# Patient Record
Sex: Female | Born: 1975 | Race: White | Hispanic: No | Marital: Married | Smoking: Never smoker
Health system: Southern US, Community
[De-identification: ages and names within clinical notes are randomized; demographics above are authoritative.]

## PROBLEM LIST (undated history)

## (undated) DIAGNOSIS — F419 Anxiety disorder, unspecified: Secondary | ICD-10-CM

## (undated) HISTORY — DX: Anxiety disorder, unspecified: F41.9

## (undated) HISTORY — PX: CHOLECYSTECTOMY: SHX55

## (undated) HISTORY — PX: ABDOMINAL SURGERY: SHX537

---

## 2001-03-28 ENCOUNTER — Encounter: Payer: Self-pay | Admitting: Emergency Medicine

## 2001-03-28 ENCOUNTER — Emergency Department (HOSPITAL_COMMUNITY): Admission: EM | Admit: 2001-03-28 | Discharge: 2001-03-28 | Payer: Self-pay | Admitting: Emergency Medicine

## 2001-04-09 ENCOUNTER — Emergency Department (HOSPITAL_COMMUNITY): Admission: EM | Admit: 2001-04-09 | Discharge: 2001-04-09 | Payer: Self-pay | Admitting: Emergency Medicine

## 2001-04-11 ENCOUNTER — Ambulatory Visit (HOSPITAL_COMMUNITY): Admission: RE | Admit: 2001-04-11 | Discharge: 2001-04-11 | Payer: Self-pay | Admitting: Emergency Medicine

## 2001-04-11 ENCOUNTER — Encounter: Payer: Self-pay | Admitting: Emergency Medicine

## 2001-04-16 ENCOUNTER — Emergency Department (HOSPITAL_COMMUNITY): Admission: EM | Admit: 2001-04-16 | Discharge: 2001-04-16 | Payer: Self-pay | Admitting: *Deleted

## 2001-07-21 ENCOUNTER — Observation Stay (HOSPITAL_COMMUNITY): Admission: RE | Admit: 2001-07-21 | Discharge: 2001-07-22 | Payer: Self-pay | Admitting: General Surgery

## 2001-10-05 ENCOUNTER — Encounter: Admission: RE | Admit: 2001-10-05 | Discharge: 2001-10-05 | Payer: Self-pay | Admitting: Gastroenterology

## 2001-10-05 ENCOUNTER — Encounter: Payer: Self-pay | Admitting: Gastroenterology

## 2001-10-06 ENCOUNTER — Emergency Department (HOSPITAL_COMMUNITY): Admission: EM | Admit: 2001-10-06 | Discharge: 2001-10-06 | Payer: Self-pay | Admitting: Emergency Medicine

## 2002-03-23 HISTORY — PX: TUBAL LIGATION: SHX77

## 2004-09-27 ENCOUNTER — Emergency Department (HOSPITAL_COMMUNITY): Admission: EM | Admit: 2004-09-27 | Discharge: 2004-09-27 | Payer: Self-pay | Admitting: Emergency Medicine

## 2004-10-14 ENCOUNTER — Ambulatory Visit: Payer: Self-pay | Admitting: Family Medicine

## 2004-10-24 ENCOUNTER — Ambulatory Visit: Payer: Self-pay | Admitting: Family Medicine

## 2005-01-01 ENCOUNTER — Ambulatory Visit: Payer: Self-pay | Admitting: Family Medicine

## 2005-02-04 ENCOUNTER — Ambulatory Visit: Payer: Self-pay | Admitting: Family Medicine

## 2005-03-02 ENCOUNTER — Emergency Department (HOSPITAL_COMMUNITY): Admission: EM | Admit: 2005-03-02 | Discharge: 2005-03-02 | Payer: Self-pay | Admitting: Emergency Medicine

## 2005-03-04 ENCOUNTER — Ambulatory Visit: Payer: Self-pay | Admitting: Family Medicine

## 2005-04-01 ENCOUNTER — Ambulatory Visit: Payer: Self-pay | Admitting: Family Medicine

## 2005-06-25 ENCOUNTER — Ambulatory Visit: Payer: Self-pay | Admitting: Family Medicine

## 2005-12-16 ENCOUNTER — Ambulatory Visit: Payer: Self-pay | Admitting: Family Medicine

## 2006-01-18 ENCOUNTER — Ambulatory Visit: Payer: Self-pay | Admitting: Family Medicine

## 2006-03-24 ENCOUNTER — Ambulatory Visit: Payer: Self-pay | Admitting: Family Medicine

## 2006-05-31 ENCOUNTER — Ambulatory Visit: Payer: Self-pay | Admitting: Family Medicine

## 2011-07-15 ENCOUNTER — Ambulatory Visit (HOSPITAL_COMMUNITY)
Admission: RE | Admit: 2011-07-15 | Discharge: 2011-07-15 | Disposition: A | Discharge status: Home / Self Care | Payer: 59 | Source: Ambulatory Visit | Attending: Pediatrics | Admitting: Pediatrics

## 2011-07-15 ENCOUNTER — Encounter (HOSPITAL_COMMUNITY): Payer: Self-pay

## 2011-07-15 ENCOUNTER — Other Ambulatory Visit (HOSPITAL_COMMUNITY): Payer: Self-pay | Admitting: Pediatrics

## 2011-07-15 DIAGNOSIS — R509 Fever, unspecified: Secondary | ICD-10-CM

## 2011-07-15 DIAGNOSIS — R1012 Left upper quadrant pain: Secondary | ICD-10-CM | POA: Insufficient documentation

## 2011-07-15 DIAGNOSIS — R110 Nausea: Secondary | ICD-10-CM

## 2011-07-15 DIAGNOSIS — M549 Dorsalgia, unspecified: Secondary | ICD-10-CM | POA: Insufficient documentation

## 2011-07-15 DIAGNOSIS — R197 Diarrhea, unspecified: Secondary | ICD-10-CM

## 2011-07-15 MED ORDER — IOHEXOL 300 MG/ML  SOLN
100.0000 mL | Freq: Once | INTRAMUSCULAR | Status: AC | PRN
  Administered 2011-07-15: 100 mL via INTRAVENOUS

## 2012-01-19 ENCOUNTER — Encounter (HOSPITAL_COMMUNITY): Payer: Self-pay | Admitting: Emergency Medicine

## 2012-01-19 ENCOUNTER — Emergency Department (HOSPITAL_COMMUNITY)
Admission: EM | Admit: 2012-01-19 | Discharge: 2012-01-20 | Disposition: A | Discharge status: Home / Self Care | Payer: 59 | Attending: Emergency Medicine | Admitting: Emergency Medicine

## 2012-01-19 DIAGNOSIS — R112 Nausea with vomiting, unspecified: Secondary | ICD-10-CM | POA: Insufficient documentation

## 2012-01-19 DIAGNOSIS — Z79899 Other long term (current) drug therapy: Secondary | ICD-10-CM | POA: Insufficient documentation

## 2012-01-19 DIAGNOSIS — R197 Diarrhea, unspecified: Secondary | ICD-10-CM | POA: Insufficient documentation

## 2012-01-19 MED ORDER — ONDANSETRON HCL 4 MG/2ML IJ SOLN
4.0000 mg | Freq: Once | INTRAMUSCULAR | Status: AC
  Administered 2012-01-20: 4 mg via INTRAVENOUS
  Filled 2012-01-19: qty 2

## 2012-01-19 MED ORDER — SODIUM CHLORIDE 0.9 % IV BOLUS (SEPSIS)
1000.0000 mL | Freq: Once | INTRAVENOUS | Status: AC
  Administered 2012-01-20: 1000 mL via INTRAVENOUS

## 2012-01-19 MED ORDER — HYDROMORPHONE HCL PF 1 MG/ML IJ SOLN
1.0000 mg | Freq: Once | INTRAMUSCULAR | Status: AC
  Administered 2012-01-20: 1 mg via INTRAVENOUS
  Filled 2012-01-19: qty 1

## 2012-01-19 MED ORDER — PANTOPRAZOLE SODIUM 40 MG IV SOLR
40.0000 mg | Freq: Once | INTRAVENOUS | Status: AC
  Administered 2012-01-20: 40 mg via INTRAVENOUS
  Filled 2012-01-19: qty 40

## 2012-01-19 NOTE — ED Notes (Signed)
Pt complains of n/v/d and abdominal pain that started suddenly around 7pm tonight. Denies any injury to abdomen. States abd is cramping all over. No increased pain with palpation. States she feels like she has a "knot" in her RUQ. No other complaints. 500cc bolus and 4mg  zofran given my EMS en route.

## 2012-01-20 LAB — URINALYSIS, ROUTINE W REFLEX MICROSCOPIC
Bilirubin Urine: NEGATIVE
Glucose, UA: NEGATIVE mg/dL
Hgb urine dipstick: NEGATIVE
Ketones, ur: >80 mg/dL — AB
Leukocytes, UA: NEGATIVE
pH: 7 (ref 5.0–8.0)

## 2012-01-20 MED ORDER — ONDANSETRON HCL 4 MG/2ML IJ SOLN
4.0000 mg | Freq: Once | INTRAMUSCULAR | Status: AC
  Administered 2012-01-20: 4 mg via INTRAVENOUS
  Filled 2012-01-20: qty 2

## 2012-01-20 MED ORDER — SODIUM CHLORIDE 0.9 % IV BOLUS (SEPSIS)
1000.0000 mL | Freq: Once | INTRAVENOUS | Status: AC
  Administered 2012-01-20: 1000 mL via INTRAVENOUS

## 2012-01-20 MED ORDER — ONDANSETRON 4 MG PO TBDP: 4.0000 mg | ORAL_TABLET | Freq: Three times a day (TID) | ORAL | Status: DC | PRN

## 2012-01-20 NOTE — ED Provider Notes (Signed)
History     CSN: [id number]  Arrival date & time 01/19/12  2341   First MD Initiated Contact with Patient 01/19/12 2353      Chief Complaint  Patient presents with  . Abdominal Pain    (Consider location/radiation/quality/duration/timing/severity/associated sxs/prior treatment) HPI Danielle Pope is a 36 y.o. female brought in by ambulance, who presents to the Emergency Department complaining of nausea, vomiting and diarrhea that began at 1930. It is associated with abdominal cramping. Ate at 1800 chicken and a biscuit. Ate peanuts earlier in the day.   PCP Dr. Milford Cage   History reviewed. No pertinent past medical history.  Past Surgical History  Procedure Date  . Cholecystectomy   . Cesarean section x2    No family history on file.  History  Substance Use Topics  . Smoking status: Never Smoker   . Smokeless tobacco: Not on file  . Alcohol Use: No    OB History    Grav Para Term Preterm Abortions TAB SAB Ect Mult Living                  Review of Systems  Constitutional: Negative for fever.       10 Systems reviewed and are negative for acute change except as noted in the HPI.  HENT: Negative for congestion.   Eyes: Negative for discharge and redness.  Respiratory: Negative for cough and shortness of breath.   Cardiovascular: Negative for chest pain.  Gastrointestinal: Positive for nausea, vomiting, abdominal pain and diarrhea.  Musculoskeletal: Negative for back pain.  Skin: Negative for rash.  Neurological: Negative for syncope, numbness and headaches.  Psychiatric/Behavioral:       No behavior change.    Allergies  Review of patient's allergies indicates no known allergies.  Home Medications   Current Outpatient Rx  Name Route Sig Dispense Refill  . DULOXETINE HCL 60 MG PO CPEP Oral Take 60 mg by mouth daily.      BP 112/61  Pulse 80  Temp 98.1 F (36.7 C) (Oral)  Resp 16  Ht 5\' 5"  (1.651 m)  Wt 225 lb (102.059 kg)  BMI 37.44 kg/m2  SpO2  100%  LMP 12/26/2011  Physical Exam  Nursing note and vitals reviewed. Constitutional: She appears well-developed and well-nourished.       Awake, alert, nontoxic appearance.  HENT:  Head: Atraumatic.  Eyes: Right eye exhibits no discharge. Left eye exhibits no discharge.  Neck: Neck supple.  Cardiovascular: Normal heart sounds.   Pulmonary/Chest: Effort normal and breath sounds normal. She exhibits no tenderness.  Abdominal: Soft. There is no tenderness. There is no rebound.       Bowel sounds hyperactive. Mild tenderness across the upper abdomen  Musculoskeletal: She exhibits no tenderness.       Baseline ROM, no obvious new focal weakness.  Neurological:       Mental status and motor strength appears baseline for patient and situation.  Skin: No rash noted.  Psychiatric: She has a normal mood and affect.    ED Course  Procedures (including critical care time) Results for orders placed during the hospital encounter of 01/19/12  URINALYSIS, ROUTINE W REFLEX MICROSCOPIC      Component Value Range   Color, Urine YELLOW  YELLOW   APPearance CLEAR  CLEAR   Specific Gravity, Urine 1.020  1.005 - 1.030   pH 7.0  5.0 - 8.0   Glucose, UA NEGATIVE  NEGATIVE mg/dL   Hgb urine dipstick NEGATIVE  NEGATIVE  Bilirubin Urine NEGATIVE  NEGATIVE   Ketones, ur >80 (*) NEGATIVE mg/dL   Protein, ur NEGATIVE  NEGATIVE mg/dL   Urobilinogen, UA 0.2  0.0 - 1.0 mg/dL   Nitrite NEGATIVE  NEGATIVE   Leukocytes, UA NEGATIVE  NEGATIVE    0300 Patient has received 2.5 liters of IVF. Feels better. Able to take PO fluids. No further vomiting or diarrhea.   MDM  Patient with nausea, vomiting, diarrhea illness that began at 2000 tonight. Received fluids, antiemetics, analgesic, ppi with improvement. Pt feels improved after observation and/or treatment in ED.Pt stable in ED with no significant deterioration in condition.The patient appears reasonably screened and/or stabilized for discharge and I doubt  any other medical condition or other [facility] requiring further screening, evaluation, or treatment in the ED at this time prior to discharge.  MDM Reviewed: nursing note and vitals Interpretation: labs           Nicoletta Dress. Colon Branch, MD 01/20/12 [phone]

## 2012-05-31 ENCOUNTER — Encounter (HOSPITAL_COMMUNITY): Payer: Self-pay | Admitting: *Deleted

## 2012-05-31 ENCOUNTER — Emergency Department (HOSPITAL_COMMUNITY)
Admission: EM | Admit: 2012-05-31 | Discharge: 2012-06-01 | Disposition: A | Discharge status: Home / Self Care | Payer: Self-pay | Attending: Emergency Medicine | Admitting: Emergency Medicine

## 2012-05-31 DIAGNOSIS — R110 Nausea: Secondary | ICD-10-CM | POA: Insufficient documentation

## 2012-05-31 DIAGNOSIS — R002 Palpitations: Secondary | ICD-10-CM | POA: Insufficient documentation

## 2012-05-31 DIAGNOSIS — R42 Dizziness and giddiness: Secondary | ICD-10-CM | POA: Insufficient documentation

## 2012-05-31 NOTE — ED Provider Notes (Addendum)
History     CSN: [id number]  Arrival date & time 05/31/12  2225   First MD Initiated Contact with Patient 05/31/12 2331      Chief Complaint  Patient presents with  . Chest Pain    (Consider location/radiation/quality/duration/timing/severity/associated sxs/prior treatment) HPI Danielle Pope is a 37 y.o. female who presents to the Emergency Department complaining of palpitations. She felt palpitations last night and again today. Today they were associated with nausea and dizziness.  Currently without palpitations.   No past medical history on file.  Past Surgical History  Procedure Laterality Date  . Cholecystectomy    . Cesarean section  x2    History reviewed. No pertinent family history.  History  Substance Use Topics  . Smoking status: Never Smoker   . Smokeless tobacco: Not on file  . Alcohol Use: No    OB History   Grav Para Term Preterm Abortions TAB SAB Ect Mult Living                  Review of Systems  Constitutional: Negative for fever.       10 Systems reviewed and are negative for acute change except as noted in the HPI.  HENT: Negative for congestion.   Eyes: Negative for discharge and redness.  Respiratory: Negative for cough and shortness of breath.   Cardiovascular: Positive for palpitations. Negative for chest pain.  Gastrointestinal: Negative for vomiting and abdominal pain.  Musculoskeletal: Negative for back pain.  Skin: Negative for rash.  Neurological: Negative for syncope, numbness and headaches.  Psychiatric/Behavioral:       No behavior change.    Allergies  Review of patient's allergies indicates no known allergies.  Home Medications   Current Outpatient Rx  Name  Route  Sig  Dispense  Refill  . DULoxetine (CYMBALTA) 60 MG capsule   Oral   Take 60 mg by mouth daily.         . ondansetron (ZOFRAN ODT) 4 MG disintegrating tablet   Oral   Take 1 tablet (4 mg total) by mouth every 8 (eight) hours as needed for nausea.   20  tablet   0     BP 128/74  Pulse 67  Temp(Src) 98.4 F (36.9 C) (Oral)  Resp 16  Ht 5\' 6"  (1.676 m)  Wt 225 lb (102.059 kg)  BMI 36.33 kg/m2  SpO2 99%  LMP 05/27/2012  Physical Exam  Nursing note and vitals reviewed. Constitutional:  Awake, alert, nontoxic appearance.  HENT:  Head: Normocephalic and atraumatic.  Right Ear: External ear normal.  Left Ear: External ear normal.  Eyes: EOM are normal. Pupils are equal, round, and reactive to light. Right eye exhibits no discharge. Left eye exhibits no discharge.  Neck: Normal range of motion. Neck supple.  Cardiovascular: Normal rate, normal heart sounds and intact distal pulses.   Pulmonary/Chest: Effort normal and breath sounds normal. She exhibits no tenderness.  Abdominal: Soft. Bowel sounds are normal. There is no tenderness. There is no rebound.  Musculoskeletal: She exhibits no tenderness.  Baseline ROM, no obvious new focal weakness.  Neurological:  Mental status and motor strength appears baseline for patient and situation.  Skin: No rash noted.  Psychiatric: She has a normal mood and affect.    ED Course  Procedures (including critical care time)   Date: 05/31/2012  2349  Rate: 68  Rhythm: normal sinus rhythm and premature ventricular contractions (PVC)  QRS Axis: normal  Intervals: normal  ST/T Wave  abnormalities: normal  Conduction Disutrbances:none  Narrative Interpretation:   Old EKG Reviewed: none available    MDM  Patient with c/o palpitations. Instructed the patient on conditions that will exacerbate palpitations. Pt stable in ED with no significant deterioration in condition.The patient appears reasonably screened and/or stabilized for discharge and I doubt any other medical condition or other [facility] requiring further screening, evaluation, or treatment in the ED at this time prior to discharge.  MDM Reviewed: nursing note and vitals Interpretation: x-ray           Nicoletta Dress. Colon Branch,  MD 05/31/12 2349  Nicoletta Dress. Colon Branch, MD 05/31/12 1610

## 2012-05-31 NOTE — ED Notes (Signed)
Pt c/o palpitations. Denies nausea, dizziness, blurred vision.

## 2012-05-31 NOTE — ED Notes (Signed)
Sunday with palpitations and today 30 minutes ago, dizziness, nausea; denies hx of palpitations

## 2012-06-01 MED ORDER — ONDANSETRON 4 MG PO TBDP: 4.0000 mg | ORAL_TABLET | Freq: Three times a day (TID) | ORAL | Status: DC | PRN

## 2012-07-29 ENCOUNTER — Ambulatory Visit (INDEPENDENT_AMBULATORY_CARE_PROVIDER_SITE_OTHER): Payer: 59 | Admitting: Internal Medicine

## 2012-07-29 ENCOUNTER — Encounter: Payer: Self-pay | Admitting: Internal Medicine

## 2012-07-29 VITALS — BP 121/80 | HR 67 | Ht 66.0 in | Wt 250.0 lb

## 2012-07-29 DIAGNOSIS — E7800 Pure hypercholesterolemia, unspecified: Secondary | ICD-10-CM

## 2012-07-29 DIAGNOSIS — R002 Palpitations: Secondary | ICD-10-CM

## 2012-07-29 DIAGNOSIS — I10 Essential (primary) hypertension: Secondary | ICD-10-CM

## 2012-07-29 LAB — BASIC METABOLIC PANEL
BUN: 11 mg/dL (ref 6–23)
Creat: 0.8 mg/dL (ref 0.50–1.10)
Potassium: 3.9 mEq/L (ref 3.5–5.3)

## 2012-07-29 LAB — CBC
HCT: 37.5 % (ref 36.0–46.0)
Hemoglobin: 13 g/dL (ref 12.0–15.0)
MCHC: 34.7 g/dL (ref 30.0–36.0)

## 2012-07-29 LAB — LIPID PANEL
Cholesterol: 190 mg/dL (ref 0–200)
Total CHOL/HDL Ratio: 4.3 Ratio
VLDL: 40 mg/dL (ref 0–40)

## 2012-07-29 NOTE — Progress Notes (Signed)
HPI Patient is referred for evluation of palpitations.  They began around march 9th  She was in yard working .  When they did not go away she went to [facility] ER>   She says the palpitations occur most every day  Usually notices them when sitting or in bed.  Bad at times  Has to sit up.  No dizziness.  No CP  Most of time breathing is OK WPatient has acid reflux Was drinking diet Dr Reino Kent before  None since ER visit.  Patient's MGM died of CAD at 68.  Great uncle with CAD No Known Allergies  Current Outpatient Prescriptions  Medication Sig Dispense Refill  . DULoxetine (CYMBALTA) 60 MG capsule Take 60 mg by mouth daily.      Marland Kitchen LORazepam (ATIVAN) 0.5 MG tablet Take 0.5 mg by mouth as needed for anxiety.      . ondansetron (ZOFRAN ODT) 4 MG disintegrating tablet Take 1 tablet (4 mg total) by mouth every 8 (eight) hours as needed for nausea.  20 tablet  0   No current facility-administered medications for this visit.    No past medical history on file.  Past Surgical History  Procedure Laterality Date  . Cholecystectomy    . Cesarean section  x2    No family history on file.  History   Social History  . Marital Status: Married    Spouse Name: N/A    Number of Children: N/A  . Years of Education: N/A   Occupational History  . Not on file.   Social History Main Topics  . Smoking status: Never Smoker   . Smokeless tobacco: Not on file  . Alcohol Use: No  . Drug Use: No  . Sexually Active:    Other Topics Concern  . Not on file   Social History Narrative  . No narrative on file    Review of Systems:  All systems reviewed.  They are negative to the above problem except as previously stated.  Vital Signs: BP 121/80  Pulse 67  Ht 5\' 6"  (1.676 m)  Wt 250 lb (113.399 kg)  BMI 40.37 kg/m2  Physical Exam Patient is in NAD HEENT:  Normocephalic, atraumatic. EOMI, PERRLA.  Neck: JVP is normal.  No bruits.  Lungs: clear to auscultation. No rales no wheezes.  Heart:  Regular rate and rhythm. Normal S1, S2. No S3.   No significant murmurs. PMI not displaced.  Abdomen:  Supple, nontender. Normal bowel sounds. No masses. No hepatomegaly.  Extremities:   Good distal pulses throughout. No lower extremity edema.  Musculoskeletal :moving all extremities.  Neuro:   alert and oriented x3.  CN II-XII grossly intact.  EKG in ER 3/11  SR 64  Isolated PVC  QT interval normal Assessment and Plan:  1.  PVCs.  Patient has been symptomatic with skips since March  Do not sound hemodynamically destabilizing but botherome   Would recomm echo, 48 hour holter, CBC, BMET, TSH, Lipid panel  2.  HCM  Check lipids  Will need to discuss weight  Defer until w/u back.

## 2012-07-29 NOTE — Patient Instructions (Addendum)
Your physician recommends that you schedule a follow-up appointment in: FOLLOW UP TO BE DETERMINED  Your physician has requested that you have an echocardiogram. Echocardiography is a painless test that uses sound waves to create images of your heart. It provides your doctor with information about the size and shape of your heart and how well your heart's chambers and valves are working. This procedure takes approximately one hour. There are no restrictions for this procedure.  Your physician has recommended that you wear a holter monitor. Holter monitors are medical devices that record the heart's electrical activity. Doctors most often use these monitors to diagnose arrhythmias. Arrhythmias are problems with the speed or rhythm of the heartbeat. The monitor is a small, portable device. You can wear one while you do your normal daily activities. This is usually used to diagnose what is causing palpitations/syncope (passing out).48 HOURS  Your physician recommends that you return for lab work in: TODAY (CBC,BMET,TSH,LIPID) SLIPS GIVEN

## 2012-08-01 ENCOUNTER — Encounter: Payer: Self-pay | Admitting: *Deleted

## 2012-08-03 ENCOUNTER — Ambulatory Visit (HOSPITAL_COMMUNITY)
Admission: RE | Admit: 2012-08-03 | Discharge: 2012-08-03 | Disposition: A | Discharge status: Home / Self Care | Payer: 59 | Source: Ambulatory Visit | Attending: Internal Medicine | Admitting: Internal Medicine

## 2012-08-03 DIAGNOSIS — Z8249 Family history of ischemic heart disease and other diseases of the circulatory system: Secondary | ICD-10-CM | POA: Insufficient documentation

## 2012-08-03 DIAGNOSIS — R002 Palpitations: Secondary | ICD-10-CM | POA: Insufficient documentation

## 2012-08-03 DIAGNOSIS — R079 Chest pain, unspecified: Secondary | ICD-10-CM

## 2012-08-03 DIAGNOSIS — I4949 Other premature depolarization: Secondary | ICD-10-CM | POA: Insufficient documentation

## 2012-08-03 NOTE — Progress Notes (Signed)
*  PRELIMINARY RESULTS* Echocardiogram 2D Echocardiogram has been performed.  Danielle Pope 08/03/2012, 1:47 PM

## 2012-08-03 NOTE — Progress Notes (Signed)
*  PRELIMINARY RESULTS* Echocardiogram 48H Holter monitor has been performed.  Conrad Whalan 08/03/2012, 1:48 PM

## 2012-08-10 ENCOUNTER — Telehealth: Payer: Self-pay | Admitting: *Deleted

## 2012-08-10 NOTE — Telephone Encounter (Signed)
PT CALLING FOR TEST RESULTS DONE LAST WEEK.TMJ

## 2012-08-10 NOTE — Telephone Encounter (Signed)
Results are still pending for holter and echo, noted pt home number states "All circuits are busy now, please try call again later" times two, will call again

## 2012-08-11 NOTE — Procedures (Signed)
Danielle Pope, PRUNTY NO.:  [id number]  MEDICAL RECORD NO.:  [id number]  LOCATION:  CARDIOPU                      FACILITY:  [facility]  PHYSICIAN:  Gerrit Friends. Dietrich Pates, MD, FACCDATE OF BIRTH:  04-29-1975  DATE OF PROCEDURE:  08/03/2012 DATE OF DISCHARGE:  08/03/2012                               HOLTER MONITOR   REFERRING DOCTOR:  Delaney Meigs, M.D.  CLINICAL DATA:  37 year old woman with PVCs and chest discomfort.  1. Continuous electrocardiographic recording was maintained for 47     hours and 53 minutes during which the predominant rhythm was normal     sinus with sinus tachycardia to rates as high as 125 BPM and modest     sinus bradycardia with a minimal recorded rate of 52 BPM. 2. Moderately frequent PVCs occurred at an average rate of 28 per     hour.  Frequency increased at times with a few short runs of     bigeminy and trigeminy. 3. Infrequent premature supraventricular depolarizations were recorded;      average rate was 3 per hour. 4. No significant ST-segment elevation or depression was identified. 5. Multiple episodes of palpitations were reported, a few with     accompanying arm pain and one with associated dizziness.  For some     of these, the patient reported "multiple PVCs" and for some "light     PVCs."  For the first 5 episodes during which no comment concerning PVCs      was provided by the patient, none occurred.  Subsequently, in     approximately 2/3 of the symptomatic episodes, at least 1 PVC was     present, sometimes frequent PVCs.  IMPRESSION:  Abnormal continuous electrocardiographic recording revealing no significant arrhythmias and a correlation between the patient's reports of PVCs and documented PVCs.  Palpitations not infrequently occurred without any associated arrhythmia.  There is no clear relationship to arm discomfort or dizziness.  Gerrit Friends. Dietrich Pates, MD, [facility]     RMR/MEDQ  D:  08/10/2012  T:  08/11/2012  Job:   [id number]

## 2012-08-17 NOTE — Telephone Encounter (Signed)
Called pt mobile and noted no vm has been set up, called home number and noted all circuits are busy now, will call again

## 2012-08-17 NOTE — Telephone Encounter (Signed)
Pt requesting results from Echo, noted ordered by Joette Catching, however pt advised by you at last OV to have echo, please advise

## 2012-08-17 NOTE — Telephone Encounter (Signed)
Echo did not come back to me for review so I missed it LV function is normal.  No significant valvular abnormalities.

## 2012-08-18 NOTE — Telephone Encounter (Signed)
Tried to call pt again and noted the same issue as prior:  Called pt mobile and noted no vm has been set up, called home number and noted all circuits are busy now, will call again

## 2012-08-19 NOTE — Telephone Encounter (Signed)
Spoke to pt to advise results/instructions. Pt understood.  

## 2015-10-08 ENCOUNTER — Ambulatory Visit (HOSPITAL_COMMUNITY)
Admission: RE | Admit: 2015-10-08 | Discharge: 2015-10-08 | Disposition: A | Discharge status: Home / Self Care | Payer: Self-pay | Source: Ambulatory Visit | Attending: Physician Assistant | Admitting: Physician Assistant

## 2015-10-08 ENCOUNTER — Other Ambulatory Visit (HOSPITAL_COMMUNITY): Payer: Self-pay | Admitting: Physician Assistant

## 2015-10-08 DIAGNOSIS — M5441 Lumbago with sciatica, right side: Secondary | ICD-10-CM | POA: Insufficient documentation

## 2015-10-08 DIAGNOSIS — M5146 Schmorl's nodes, lumbar region: Secondary | ICD-10-CM | POA: Insufficient documentation

## 2015-10-08 DIAGNOSIS — M4807 Spinal stenosis, lumbosacral region: Secondary | ICD-10-CM | POA: Insufficient documentation

## 2015-10-08 DIAGNOSIS — M4806 Spinal stenosis, lumbar region: Secondary | ICD-10-CM | POA: Insufficient documentation

## 2015-10-08 DIAGNOSIS — M542 Cervicalgia: Secondary | ICD-10-CM | POA: Insufficient documentation

## 2015-10-08 DIAGNOSIS — M5440 Lumbago with sciatica, unspecified side: Secondary | ICD-10-CM

## 2016-07-21 HISTORY — PX: GASTRIC BYPASS: SHX52

## 2020-01-27 ENCOUNTER — Emergency Department (HOSPITAL_COMMUNITY): Payer: Self-pay

## 2020-01-27 ENCOUNTER — Encounter (HOSPITAL_COMMUNITY): Payer: Self-pay | Admitting: Emergency Medicine

## 2020-01-27 ENCOUNTER — Other Ambulatory Visit: Payer: Self-pay

## 2020-01-27 ENCOUNTER — Emergency Department (HOSPITAL_COMMUNITY)
Admission: EM | Admit: 2020-01-27 | Discharge: 2020-01-28 | Disposition: A | Discharge status: Home / Self Care | Payer: Self-pay | Attending: Emergency Medicine | Admitting: Emergency Medicine

## 2020-01-27 DIAGNOSIS — R350 Frequency of micturition: Secondary | ICD-10-CM | POA: Insufficient documentation

## 2020-01-27 DIAGNOSIS — R109 Unspecified abdominal pain: Secondary | ICD-10-CM

## 2020-01-27 DIAGNOSIS — R1011 Right upper quadrant pain: Secondary | ICD-10-CM | POA: Insufficient documentation

## 2020-01-27 DIAGNOSIS — Z9884 Bariatric surgery status: Secondary | ICD-10-CM | POA: Insufficient documentation

## 2020-01-27 LAB — URINALYSIS, ROUTINE W REFLEX MICROSCOPIC
Bilirubin Urine: NEGATIVE
Glucose, UA: NEGATIVE mg/dL
Hgb urine dipstick: NEGATIVE
Ketones, ur: NEGATIVE mg/dL
Leukocytes,Ua: NEGATIVE
Nitrite: NEGATIVE
Protein, ur: NEGATIVE mg/dL
Specific Gravity, Urine: 1.02 (ref 1.005–1.030)
pH: 5 (ref 5.0–8.0)

## 2020-01-27 LAB — LIPASE, BLOOD: Lipase: 27 U/L (ref 11–51)

## 2020-01-27 LAB — CBC
HCT: 33.6 % — ABNORMAL LOW (ref 36.0–46.0)
Hemoglobin: 10.4 g/dL — ABNORMAL LOW (ref 12.0–15.0)
MCH: 26.5 pg (ref 26.0–34.0)
MCHC: 31 g/dL (ref 30.0–36.0)
MCV: 85.5 fL (ref 80.0–100.0)
Platelets: 258 10*3/uL (ref 150–400)
RBC: 3.93 MIL/uL (ref 3.87–5.11)
RDW: 14.4 % (ref 11.5–15.5)
WBC: 8.3 10*3/uL (ref 4.0–10.5)
nRBC: 0 % (ref 0.0–0.2)

## 2020-01-27 LAB — COMPREHENSIVE METABOLIC PANEL
ALT: 21 U/L (ref 0–44)
AST: 17 U/L (ref 15–41)
Albumin: 4.2 g/dL (ref 3.5–5.0)
Alkaline Phosphatase: 101 U/L (ref 38–126)
Anion gap: 8 (ref 5–15)
BUN: 11 mg/dL (ref 6–20)
CO2: 25 mmol/L (ref 22–32)
Calcium: 8.7 mg/dL — ABNORMAL LOW (ref 8.9–10.3)
Chloride: 105 mmol/L (ref 98–111)
Creatinine, Ser: 0.64 mg/dL (ref 0.44–1.00)
GFR, Estimated: >60 mL/min (ref >60)
Glucose, Bld: 104 mg/dL — ABNORMAL HIGH (ref 70–99)
Potassium: 3.2 mmol/L — ABNORMAL LOW (ref 3.5–5.1)
Sodium: 138 mmol/L (ref 135–145)
Total Bilirubin: 0.3 mg/dL (ref 0.3–1.2)
Total Protein: 7.1 g/dL (ref 6.5–8.1)

## 2020-01-27 LAB — POC URINE PREG, ED: Preg Test, Ur: NEGATIVE

## 2020-01-27 MED ORDER — IOHEXOL 300 MG/ML  SOLN
100.0000 mL | Freq: Once | INTRAMUSCULAR | Status: AC | PRN
  Administered 2020-01-27: 100 mL via INTRAVENOUS

## 2020-01-27 MED ORDER — FENTANYL CITRATE (PF) 100 MCG/2ML IJ SOLN
50.0000 ug | Freq: Once | INTRAMUSCULAR | Status: AC
  Administered 2020-01-27: 50 ug via INTRAVENOUS
  Filled 2020-01-27: qty 2

## 2020-01-27 MED ORDER — ONDANSETRON HCL 4 MG/2ML IJ SOLN
4.0000 mg | Freq: Once | INTRAMUSCULAR | Status: AC
  Administered 2020-01-27: 4 mg via INTRAVENOUS
  Filled 2020-01-27: qty 2

## 2020-01-27 NOTE — ED Triage Notes (Signed)
Pt c/o right sided flank pain that started at 0400 today.

## 2020-01-27 NOTE — ED Provider Notes (Signed)
[facility] EMERGENCY DEPARTMENT Provider Note   CSN: [id number] Arrival date & time: 01/27/20  2117     History Chief Complaint  Patient presents with  . Flank Pain    Danielle Pope is a 44 y.o. female who presents emergency department chief complaint of right upper quadrant abdominal pain.  She is status post gastric bypass and cholecystectomy.  Patient states that she had onset of f right upper quadrant abdominal pain radiating to her back starting 2 days ago however it was intermittent and seem to go away.  This morning at 4 AM she was awoken from sleep and has had persistent pain radiating to her left upper back/flank region she denies fevers, chills, nausea, vomiting, diarrhea or constipation.  She has noticed some increased urinary frequency but denies dysuria, hematuria.  She denies chest pain, shortness of breath.  HPI     History reviewed. No pertinent past medical history.  There are no problems to display for this patient.   Past Surgical History:  Procedure Laterality Date  . CESAREAN SECTION  x2  . CHOLECYSTECTOMY    . GASTRIC BYPASS  07/2016     OB History   No obstetric history on file.     Family History  Problem Relation Age of Onset  . Heart attack Maternal Grandmother     Social History   Tobacco Use  . Smoking status: Never Smoker  Substance Use Topics  . Alcohol use: No  . Drug use: No    Home Medications Prior to Admission medications   Medication Sig Start Date End Date Taking? Authorizing Provider  DULoxetine (CYMBALTA) 60 MG capsule Take 60 mg by mouth daily.   Yes [provider]  LORazepam (ATIVAN) 0.5 MG tablet Take 0.5 mg by mouth as needed for anxiety.   Yes [provider]  ondansetron (ZOFRAN ODT) 4 MG disintegrating tablet Take 1 tablet (4 mg total) by mouth every 8 (eight) hours as needed for nausea. 01/20/12  Yes Annamarie Dawley, MD  Probiotic Product (PROBIOTIC ADVANCED PO) Take 1 capsule by mouth daily.    Yes [provider]    Allergies    Nsaids  Review of Systems   Review of Systems Ten systems reviewed and are negative for acute change, except as noted in the HPI.   Physical Exam Updated Vital Signs BP 113/76   Pulse 69   Temp 98.2 F (36.8 C)   Resp 18   Ht 5\' 4"  (1.626 m)   Wt 89.8 kg   SpO2 100%   BMI 33.99 kg/m   Physical Exam Vitals and nursing note reviewed.  Constitutional:      General: She is not in acute distress.    Appearance: She is well-developed. She is not diaphoretic.  HENT:     Head: Normocephalic and atraumatic.  Eyes:     General: No scleral icterus.    Conjunctiva/sclera: Conjunctivae normal.  Cardiovascular:     Rate and Rhythm: Normal rate and regular rhythm.     Heart sounds: Normal heart sounds. No murmur heard.  No friction rub. No gallop.   Pulmonary:     Effort: Pulmonary effort is normal. No respiratory distress.     Breath sounds: Normal breath sounds.  Abdominal:     General: Bowel sounds are normal. There is no distension.     Palpations: Abdomen is soft. There is no mass.     Tenderness: There is abdominal tenderness in the right upper quadrant.  There is no right CVA tenderness, left CVA tenderness or guarding.  Musculoskeletal:     Cervical back: Normal range of motion.  Skin:    General: Skin is warm and dry.  Neurological:     Mental Status: She is alert and oriented to person, place, and time.  Psychiatric:        Behavior: Behavior normal.     ED Results / Procedures / Treatments   Labs (all labs ordered are listed, but only abnormal results are displayed) Labs Reviewed  CBC - Abnormal; Notable for the following components:      Result Value   Hemoglobin 10.4 (*)    HCT 33.6 (*)    All other components within normal limits  URINALYSIS, ROUTINE W REFLEX MICROSCOPIC  COMPREHENSIVE METABOLIC PANEL  LIPASE, BLOOD  POC URINE PREG, ED    EKG None  Radiology No results  found.  Procedures Procedures (including critical care time)  Medications Ordered in ED Medications  fentaNYL (SUBLIMAZE) injection 50 mcg (has no administration in time range)  ondansetron (ZOFRAN) injection 4 mg (has no administration in time range)    ED Course  I have reviewed the triage vital signs and the nursing notes.  Pertinent labs & imaging results that were available during my care of the patient were reviewed by me and considered in my medical decision making (see chart for details).    MDM Rules/Calculators/A&P                           Patient here with RUQ abd pain and flank pain. S/p lap chole and gastric bypass. Labs ordered including  CMP. Patient work up pending. Sign out given to Dr. Wilkie Aye. Final Clinical Impression(s) / ED Diagnoses Final diagnoses:  None    Rx / DC Orders ED Discharge Orders    None       Arthor Captain, PA-C 01/27/20 2309    Shon Baton, MD 01/28/20 0140

## 2020-01-28 MED ORDER — ONDANSETRON 4 MG PO TBDP: 4.0000 mg | ORAL_TABLET | Freq: Three times a day (TID) | ORAL | 0 refills | Status: DC | PRN

## 2020-01-28 NOTE — ED Notes (Signed)
Pt tolerated PO Fluid and snack without complication. MD Notified.

## 2020-01-28 NOTE — Discharge Instructions (Signed)
You were seen today for abdominal and flank pain.  Your work-up is reassuring including CT scan.  Your CT scan does have a small nodule in the lower lung.  If you are a non-smoker, this does not require follow-up and is low risk.  If you are smoker, you need a CT scan in 12 months to make sure this is stable.  Make sure that you are staying hydrated.

## 2020-02-02 ENCOUNTER — Emergency Department (HOSPITAL_COMMUNITY)
Admission: EM | Admit: 2020-02-02 | Discharge: 2020-02-02 | Disposition: A | Discharge status: Home / Self Care | Payer: Self-pay | Attending: Emergency Medicine | Admitting: Emergency Medicine

## 2020-02-02 ENCOUNTER — Encounter (HOSPITAL_COMMUNITY): Payer: Self-pay | Admitting: Emergency Medicine

## 2020-02-02 ENCOUNTER — Other Ambulatory Visit: Payer: Self-pay

## 2020-02-02 DIAGNOSIS — R109 Unspecified abdominal pain: Secondary | ICD-10-CM | POA: Insufficient documentation

## 2020-02-02 DIAGNOSIS — Z5321 Procedure and treatment not carried out due to patient leaving prior to being seen by health care provider: Secondary | ICD-10-CM | POA: Insufficient documentation

## 2020-02-02 LAB — URINALYSIS, ROUTINE W REFLEX MICROSCOPIC
Bilirubin Urine: NEGATIVE
Glucose, UA: NEGATIVE mg/dL
Hgb urine dipstick: NEGATIVE
Ketones, ur: 5 mg/dL — AB
Leukocytes,Ua: NEGATIVE
Nitrite: NEGATIVE
Protein, ur: NEGATIVE mg/dL
Specific Gravity, Urine: 1.021 (ref 1.005–1.030)
pH: 5 (ref 5.0–8.0)

## 2020-02-02 LAB — POC URINE PREG, ED: Preg Test, Ur: NEGATIVE

## 2020-02-02 NOTE — ED Provider Notes (Signed)
I went to see the patient and she was not in the room   Mancel Bale, MD 02/02/20 [phone]

## 2020-02-02 NOTE — ED Triage Notes (Signed)
Pt c/o abdominal pain that started tonight. Pt recently seen here for flank pain

## 2020-02-02 NOTE — ED Notes (Signed)
Pt assisted to bathroom and back to bed 

## 2020-05-16 ENCOUNTER — Encounter (HOSPITAL_COMMUNITY): Payer: Self-pay | Admitting: Emergency Medicine

## 2020-10-25 ENCOUNTER — Emergency Department (HOSPITAL_COMMUNITY): Payer: No Typology Code available for payment source

## 2020-10-25 ENCOUNTER — Emergency Department (HOSPITAL_COMMUNITY)
Admission: EM | Admit: 2020-10-25 | Discharge: 2020-10-25 | Disposition: A | Discharge status: Other Acute Inpatient Hospital | Payer: No Typology Code available for payment source | Attending: Emergency Medicine | Admitting: Emergency Medicine

## 2020-10-25 ENCOUNTER — Encounter (HOSPITAL_COMMUNITY): Payer: Self-pay

## 2020-10-25 ENCOUNTER — Other Ambulatory Visit: Payer: Self-pay

## 2020-10-25 DIAGNOSIS — Z20822 Contact with and (suspected) exposure to covid-19: Secondary | ICD-10-CM | POA: Diagnosis not present

## 2020-10-25 DIAGNOSIS — R1012 Left upper quadrant pain: Secondary | ICD-10-CM | POA: Diagnosis present

## 2020-10-25 DIAGNOSIS — Z9884 Bariatric surgery status: Secondary | ICD-10-CM | POA: Diagnosis not present

## 2020-10-25 DIAGNOSIS — K631 Perforation of intestine (nontraumatic): Secondary | ICD-10-CM | POA: Insufficient documentation

## 2020-10-25 LAB — COMPREHENSIVE METABOLIC PANEL
ALT: 16 U/L (ref 0–44)
AST: 16 U/L (ref 15–41)
Albumin: 3.8 g/dL (ref 3.5–5.0)
Alkaline Phosphatase: 95 U/L (ref 38–126)
Anion gap: 6 (ref 5–15)
BUN: 11 mg/dL (ref 6–20)
CO2: 24 mmol/L (ref 22–32)
Calcium: 8.2 mg/dL — ABNORMAL LOW (ref 8.9–10.3)
Chloride: 107 mmol/L (ref 98–111)
Creatinine, Ser: 0.59 mg/dL (ref 0.44–1.00)
GFR, Estimated: >60 mL/min (ref >60)
Glucose, Bld: 109 mg/dL — ABNORMAL HIGH (ref 70–99)
Potassium: 3.6 mmol/L (ref 3.5–5.1)
Sodium: 137 mmol/L (ref 135–145)
Total Bilirubin: 0.4 mg/dL (ref 0.3–1.2)
Total Protein: 6.9 g/dL (ref 6.5–8.1)

## 2020-10-25 LAB — CBC
HCT: 28.9 % — ABNORMAL LOW (ref 36.0–46.0)
Hemoglobin: 8.8 g/dL — ABNORMAL LOW (ref 12.0–15.0)
MCH: 23.5 pg — ABNORMAL LOW (ref 26.0–34.0)
MCHC: 30.4 g/dL (ref 30.0–36.0)
MCV: 77.3 fL — ABNORMAL LOW (ref 80.0–100.0)
Platelets: 339 10*3/uL (ref 150–400)
RBC: 3.74 MIL/uL — ABNORMAL LOW (ref 3.87–5.11)
RDW: 15.8 % — ABNORMAL HIGH (ref 11.5–15.5)
WBC: 8.3 10*3/uL (ref 4.0–10.5)
nRBC: 0 % (ref 0.0–0.2)

## 2020-10-25 LAB — RESP PANEL BY RT-PCR (FLU A&B, COVID) ARPGX2
Influenza A by PCR: NEGATIVE
Influenza B by PCR: NEGATIVE
SARS Coronavirus 2 by RT PCR: NEGATIVE

## 2020-10-25 LAB — POC URINE PREG, ED: Preg Test, Ur: NEGATIVE

## 2020-10-25 LAB — LIPASE, BLOOD: Lipase: 28 U/L (ref 11–51)

## 2020-10-25 MED ORDER — PIPERACILLIN-TAZOBACTAM 3.375 G IVPB 30 MIN
3.3750 g | Freq: Once | INTRAVENOUS | Status: AC
  Administered 2020-10-25: 3.375 g via INTRAVENOUS
  Filled 2020-10-25: qty 50

## 2020-10-25 MED ORDER — SODIUM CHLORIDE 0.9 % IV SOLN: INTRAVENOUS | Status: DC

## 2020-10-25 MED ORDER — IOHEXOL 350 MG/ML SOLN
85.0000 mL | Freq: Once | INTRAVENOUS | Status: AC | PRN
  Administered 2020-10-25: 85 mL via INTRAVENOUS

## 2020-10-25 MED ORDER — SODIUM CHLORIDE 0.9 % IV BOLUS
1000.0000 mL | Freq: Once | INTRAVENOUS | Status: AC
  Administered 2020-10-25: 1000 mL via INTRAVENOUS

## 2020-10-25 MED ORDER — ONDANSETRON 4 MG PO TBDP
4.0000 mg | ORAL_TABLET | Freq: Once | ORAL | Status: AC
  Administered 2020-10-25: 4 mg via ORAL
  Filled 2020-10-25: qty 1

## 2020-10-25 MED ORDER — ALUM & MAG HYDROXIDE-SIMETH 200-200-20 MG/5ML PO SUSP
30.0000 mL | Freq: Once | ORAL | Status: AC
  Administered 2020-10-25: 30 mL via ORAL
  Filled 2020-10-25: qty 30

## 2020-10-25 MED ORDER — HYDROMORPHONE HCL 1 MG/ML IJ SOLN
0.5000 mg | Freq: Once | INTRAMUSCULAR | Status: AC
  Administered 2020-10-25: 0.5 mg via INTRAVENOUS
  Filled 2020-10-25: qty 1

## 2020-10-25 MED ORDER — HYDROMORPHONE HCL 1 MG/ML IJ SOLN
1.0000 mg | Freq: Once | INTRAMUSCULAR | Status: AC
  Administered 2020-10-25: 1 mg via INTRAVENOUS
  Filled 2020-10-25: qty 1

## 2020-10-25 MED ORDER — SIMETHICONE 80 MG PO CHEW
80.0000 mg | CHEWABLE_TABLET | Freq: Once | ORAL | Status: AC
  Administered 2020-10-25: 80 mg via ORAL
  Filled 2020-10-25: qty 1

## 2020-10-25 NOTE — ED Triage Notes (Signed)
Rcems from home with cc of abdominal pain- possibly gas pain. Started last night.

## 2020-10-25 NOTE — ED Provider Notes (Signed)
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  She has intraperitoneal air.  I discussed the case with her general surgeon Dr. Lovell Sheehan who has also discussed the case with the bariatric surgeon Dr. Adolphus Birchwood, they have agreed that this patient would likely best be served at the facility where the original surgery took place and by the physicians and surgeons that have cared for her in the past.  I have spoken with Dr. Townsend Roger he, surgeon on-call for general surgery who has accepted the patient and request that the patient go emergency department to emergency department.  She has received Zosyn, her vital signs have remained stable, she is not tachycardic or hypotensive or febrile.   Eber Hong, MD 10/25/20 1022

## 2020-10-25 NOTE — ED Notes (Addendum)
Occupational psychologist paged

## 2020-10-25 NOTE — Consult Note (Signed)
Reason for Consult: Gastrojejunostomy anastomotic perforation, status post Roux-en-Y gastric bypass surgery Referring Physician: Dr. Kizzie Furnish is an 45 y.o. female.  HPI: Patient is a 45 year old female status post gastric bypass surgery at [facility] in [place] in 2019 who presents with a 1 month history of worsening epigastric pain.  She was seen by her gastroenterologist recently for diarrhea.  She presented to the emergency room today due to worsening epigastric pain.  A CT scan of the abdomen reveals evidence of a gastrojejunostomy anastomotic perforation with some free air.  No abscess cavity is present.  The free air seems to be localized to the area of the anastomosis.  Patient denies any fever or chills.  History reviewed. No pertinent past medical history.  Past Surgical History:  Procedure Laterality Date   CESAREAN SECTION  x2   CHOLECYSTECTOMY     GASTRIC BYPASS  07/2016    Family History  Problem Relation Age of Onset   Heart attack Maternal Grandmother     Social History:  reports that she has never smoked. She has never used smokeless tobacco. She reports that she does not drink alcohol and does not use drugs.  Allergies:  Allergies  Allergen Reactions   Nsaids Anaphylaxis and Other (See Comments)   Aspirin Other (See Comments)    Medications: Prior to Admission: (Not in a hospital admission)   Results for orders placed or performed during the hospital encounter of 10/25/20 (from the past 48 hour(s))  POC Urine Pregnancy, ED (not at [facility])     Status: None   Collection Time: 10/25/20  4:29 AM  Result Value Ref Range   Preg Test, Ur NEGATIVE NEGATIVE    Comment:        THE SENSITIVITY OF THIS METHODOLOGY IS >24 mIU/mL   CBC     Status: Abnormal   Collection Time: 10/25/20  4:40 AM  Result Value Ref Range   WBC 8.3 4.0 - 10.5 K/uL   RBC 3.74 (L) 3.87 - 5.11 MIL/uL   Hemoglobin 8.8 (L) 12.0 - 15.0 g/dL    Comment: Reticulocyte  Hemoglobin testing may be clinically indicated, consider ordering this additional test [id number]    HCT 28.9 (L) 36.0 - 46.0 %   MCV 77.3 (L) 80.0 - 100.0 fL   MCH 23.5 (L) 26.0 - 34.0 pg   MCHC 30.4 30.0 - 36.0 g/dL   RDW 62.2 (H) 29.7 - 98.9 %   Platelets 339 150 - 400 K/uL   nRBC 0.0 0.0 - 0.2 %    Comment: Performed at [facility], [address] [place], [place] [address]  Comprehensive metabolic panel     Status: Abnormal   Collection Time: 10/25/20  4:40 AM  Result Value Ref Range   Sodium 137 135 - 145 mmol/L   Potassium 3.6 3.5 - 5.1 mmol/L   Chloride 107 98 - 111 mmol/L   CO2 24 22 - 32 mmol/L   Glucose, Bld 109 (H) 70 - 99 mg/dL    Comment: Glucose reference range applies only to samples taken after fasting for at least 8 hours.   BUN 11 6 - 20 mg/dL   Creatinine, Ser 1.74 0.44 - 1.00 mg/dL   Calcium 8.2 (L) 8.9 - 10.3 mg/dL   Total Protein 6.9 6.5 - 8.1 g/dL   Albumin 3.8 3.5 - 5.0 g/dL   AST 16 15 - 41 U/L   ALT 16 0 - 44 U/L   Alkaline Phosphatase  95 38 - 126 U/L   Total Bilirubin 0.4 0.3 - 1.2 mg/dL   GFR, Estimated >83 >66 mL/min    Comment: (NOTE) Calculated using the CKD-EPI Creatinine Equation (2021)    Anion gap 6 5 - 15    Comment: Performed at [facility], [address] [place], [place] [address]  Lipase, blood     Status: None   Collection Time: 10/25/20  4:40 AM  Result Value Ref Range   Lipase 28 11 - 51 U/L    Comment: Performed at [facility], [address], [place], [place] [address]  Resp Panel by RT-PCR (Flu A&B, Covid) Nasopharyngeal Swab     Status: None   Collection Time: 10/25/20  7:05 AM   Specimen: Nasopharyngeal Swab; Nasopharyngeal(NP) swabs in vial transport medium  Result Value Ref Range   SARS Coronavirus 2 by RT PCR NEGATIVE NEGATIVE    Comment: (NOTE) SARS-CoV-2 target nucleic acids are NOT DETECTED.  The SARS-CoV-2 RNA is generally detectable in upper respiratory specimens during the acute phase of infection. The  lowest concentration of SARS-CoV-2 viral copies this assay can detect is 138 copies/mL. A negative result does not preclude SARS-Cov-2 infection and should not be used as the sole basis for treatment or other patient management decisions. A negative result may occur with  improper specimen collection/handling, submission of specimen other than nasopharyngeal swab, presence of viral mutation(s) within the areas targeted by this assay, and inadequate number of viral copies(<138 copies/mL). A negative result must be combined with clinical observations, patient history, and epidemiological information. The expected result is Negative.  Fact Sheet for Patients:  BloggerCourse.com  Fact Sheet for Healthcare Providers:  SeriousBroker.it  This test is no t yet approved or cleared by the [place] FDA and  has been authorized for detection and/or diagnosis of SARS-CoV-2 by FDA under an Emergency Use Authorization (EUA). This EUA will remain  in effect (meaning this test can be used) for the duration of the COVID-19 declaration under Section 564(b)(1) of the Act, 21 U.S.C.section 360bbb-3(b)(1), unless the authorization is terminated  or revoked sooner.       Influenza A by PCR NEGATIVE NEGATIVE   Influenza B by PCR NEGATIVE NEGATIVE    Comment: (NOTE) The Xpert Xpress SARS-CoV-2/FLU/RSV plus assay is intended as an aid in the diagnosis of influenza from Nasopharyngeal swab specimens and should not be used as a sole basis for treatment. Nasal washings and aspirates are unacceptable for Xpert Xpress SARS-CoV-2/FLU/RSV testing.  Fact Sheet for Patients: BloggerCourse.com  Fact Sheet for Healthcare Providers: SeriousBroker.it  This test is not yet approved or cleared by the [place] FDA and has been authorized for detection and/or diagnosis of SARS-CoV-2 by FDA under an Emergency  Use Authorization (EUA). This EUA will remain in effect (meaning this test can be used) for the duration of the COVID-19 declaration under Section 564(b)(1) of the Act, 21 U.S.C. section 360bbb-3(b)(1), unless the authorization is terminated or revoked.  Performed at [facility], [address], [place], [place] [address]     CT ABDOMEN PELVIS W CONTRAST  Result Date: 10/25/2020 CLINICAL DATA:  Bowel obstruction suspected. Abdominal pain since 10/24/2020 with blood in stool EXAM: CT ABDOMEN AND PELVIS WITH CONTRAST TECHNIQUE: Multidetector CT imaging of the abdomen and pelvis was performed using the standard protocol following bolus administration of intravenous contrast. CONTRAST:  84mL OMNIPAQUE IOHEXOL 350 MG/ML SOLN COMPARISON:  01/27/2020 FINDINGS: Lower chest:  No contributory findings. Hepatobiliary: No focal liver abnormality.Cholecystectomy.  No bile duct dilatation. Pancreas: Unremarkable. Spleen: Unremarkable. Adrenals/Urinary Tract: Negative adrenals.Horseshoe kidney. No hydronephrosis or stone. Unremarkable bladder. Stomach/Bowel: Roux-en-Y gastric bypass. Newly seen fat inflammation at the gastroenteric anastomosis with bubbles of pneumoperitoneum about the anastomosis and reaching below the left diaphragm, see reformats. No bowel obstruction. Vascular/Lymphatic: No acute vascular abnormality. No mass or adenopathy. Reproductive:No pathologic findings. Other: Trace peritoneal fluid in the pelvis which could be reactive or physiologic in this setting. Musculoskeletal: No acute abnormalities. 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 Critical Value/emergent results were called by telephone at the time of interpretation on 10/25/2020 at 6:36 am to provider [facility] , who verbally acknowledged these results. IMPRESSION: Gastric bypass complicated by marginal inflammation with perforation and small volume pneumoperitoneum. Electronically  Signed   By: Marnee Spring M.D.   On: 10/25/2020 06:36   DG Abdomen Acute W/Chest  Result Date: 10/25/2020 CLINICAL DATA:  Epigastric pain EXAM: DG ABDOMEN ACUTE WITH 1 VIEW CHEST COMPARISON:  03/02/2005 FINDINGS: Normal heart size and mediastinal contours. No acute infiltrate or edema. No effusion or pneumothorax. No acute osseous findings. Normal bowel gas pattern. Cholecystectomy clips and left upper quadrant bowel sutures. No concerning mass effect or calcification. Mild lumbar levocurvature IMPRESSION: No acute finding in the chest or abdomen. Electronically Signed   By: Marnee Spring M.D.   On: 10/25/2020 05:38    ROS:  Pertinent items are noted in HPI.  Blood pressure (!) 120/57, pulse 87, temperature 98.5 F (36.9 C), temperature source Oral, resp. rate (!) 23, height 5\' 4"  (1.626 m), weight 89.8 kg, last menstrual period 09/26/2020, SpO2 100 %. Physical Exam: Pleasant female in mild discomfort with movement. Head is normocephalic, atraumatic Lungs are clear to auscultation with equal breath sounds bilaterally Heart examination reveals regular rate and rhythm without S3, S4, murmurs Abdomen is soft with tenderness in the epigastric region.  She does not have diffuse peritoneal signs or rigidity.  CT scan images personally reviewed  Assessment/Plan: Impression: Gastrojejunostomy anastomotic disruption, acute, status post gastric bypass surgery with Roux-en-Y anastomosis in 2019 in [place]. Plan: We have reached out to the surgeons in [place] who performed the original surgery.  The original surgeon is on vacation and his partner, Dr. Teaneck, currently is out with COVID.  I did have a discussion with Dr. Adolphus Birchwood and he has agreed to contact the general surgery department at [facility] who will accept the patient as he should be able to be back at work by Monday.  As Tuesday does not do bariatric surgery, I feel this is the best option for the patient.  She does not need to  acutely go to the operating room as it appears to be a somewhat contained anastomotic perforation.  She understands that she may need surgical revision or repair of the perforation/anastomosis.  Patient has been given Zosyn and is on IV fluids.  Transfer has been initiated to [facility].  [employer] 10/25/2020, 10:24 AM

## 2020-10-25 NOTE — ED Provider Notes (Signed)
[facility]-EMERGENCY DEPT [facility] Emergency Department Provider Note MRN:  [id number]  Arrival date & time: 10/25/20     Chief Complaint   Abdominal Pain   History of Present Illness   Danielle Pope is a 45 y.o. year-old female with a history of cholecystectomy, gastric bypass presenting to the ED with chief complaint of abdominal pain.  Location: Left upper quadrant Duration: 3 to 4 hours Onset: Sudden Timing: Const Description: Sharp Severity: Severe Exacerbating/Alleviating Factors: None Associated Symptoms: Nausea, felt weak and lightheaded Pertinent Negatives: Denies fever, no lower abdominal pain, no chest pain or shortness of breath   Review of Systems  A complete 10 system review of systems was obtained and all systems are negative except as noted in the HPI and PMH.   Patient's Health History   History reviewed. No pertinent past medical history.  Past Surgical History:  Procedure Laterality Date   CESAREAN SECTION  x2   CHOLECYSTECTOMY     GASTRIC BYPASS  07/2016    Family History  Problem Relation Age of Onset   Heart attack Maternal Grandmother     Social History   Socioeconomic History   Marital status: Married    Spouse name: Not on file   Number of children: Not on file   Years of education: Not on file   Highest education level: Not on file  Occupational History   Not on file  Tobacco Use   Smoking status: Never   Smokeless tobacco: Never  Substance and Sexual Activity   Alcohol use: No   Drug use: No   Sexual activity: Not on file  Other Topics Concern   Not on file  Social History Narrative   ** Merged History Encounter **       Social Determinants of Health   Financial Resource Strain: Not on file  Food Insecurity: Not on file  Transportation Needs: Not on file  Physical Activity: Not on file  Stress: Not on file  Social Connections: Not on file  Intimate Partner Violence: Not on file     Physical Exam   Vitals:    10/25/20 0530 10/25/20 0600  BP: (!) 108/52 102/61  Pulse: 87 75  Resp: (!) 24 (!) 21  Temp:    SpO2: 99% 99%    CONSTITUTIONAL: Well-appearing, NAD NEURO:  Alert and oriented x 3, no focal deficits EYES:  eyes equal and reactive ENT/NECK:  no LAD, no JVD CARDIO: Regular rate, well-perfused, normal S1 and S2 PULM:  CTAB no wheezing or rhonchi GI/GU:  normal bowel sounds, non-distended, non-tender MSK/SPINE:  No gross deformities, no edema SKIN:  no rash, atraumatic PSYCH:  Appropriate speech and behavior  *Additional and/or pertinent findings included in MDM below  Diagnostic and Interventional Summary    EKG Interpretation  Date/Time:  10-25-2020 at 04: 19: 47 Ventricular Rate:  70 PR Interval:  174 QRS Duration: 90 QT Interval:  398 QTC Calculation: 430 R Axis:     Text Interpretation: Sinus rhythm, normal intervals. Confirmed by Dr. Kennis Carina at 5:05 AM       Labs Reviewed  CBC - Abnormal; Notable for the following components:      Result Value   RBC 3.74 (*)    Hemoglobin 8.8 (*)    HCT 28.9 (*)    MCV 77.3 (*)    MCH 23.5 (*)    RDW 15.8 (*)    All other components within normal limits  COMPREHENSIVE METABOLIC PANEL - Abnormal; Notable for  the following components:   Glucose, Bld 109 (*)    Calcium 8.2 (*)    All other components within normal limits  RESP PANEL BY RT-PCR (FLU A&B, COVID) [id number]  LIPASE, BLOOD  POC URINE PREG, ED    CT ABDOMEN PELVIS W CONTRAST  Final Result    DG Abdomen Acute W/Chest  Final Result      Medications  piperacillin-tazobactam (ZOSYN) IVPB 3.375 g (3.375 g Intravenous New Bag/Given 10/25/20 0651)  sodium chloride 0.9 % bolus 1,000 mL (has no administration in time range)  alum & mag hydroxide-simeth (MAALOX/MYLANTA) 200-200-20 MG/5ML suspension 30 mL (30 mLs Oral Given 10/25/20 0414)  ondansetron (ZOFRAN-ODT) disintegrating tablet 4 mg (4 mg Oral Given 10/25/20 0414)  simethicone (MYLICON) chewable tablet 80 mg (80 mg  Oral Given 10/25/20 0414)  HYDROmorphone (DILAUDID) injection 1 mg (1 mg Intravenous Given 10/25/20 0551)  iohexol (OMNIPAQUE) 350 MG/ML injection 85 mL (85 mLs Intravenous Contrast Given 10/25/20 0609)  HYDROmorphone (DILAUDID) injection 0.5 mg (0.5 mg Intravenous Given 10/25/20 0651)     Procedures  /  Critical Care .Critical Care  Date/Time: 10/25/2020 7:06 AM Performed by: Sabas Sous, MD Authorized by: Sabas Sous, MD   Critical care provider statement:    Critical care time (minutes):  45   Critical care was necessary to treat or prevent imminent or life-threatening deterioration of the following conditions: Colonic perforation.   Critical care was time spent personally by me on the following activities:  Discussions with consultants, evaluation of patient's response to treatment, examination of patient, ordering and performing treatments and interventions, ordering and review of laboratory studies, ordering and review of radiographic studies, pulse oximetry, re-evaluation of patient's condition, obtaining history from patient or surrogate and review of old charts  ED Course and Medical Decision Making  I have reviewed the triage vital signs, the nursing notes, and pertinent available records from the EMR.  Listed above are laboratory and imaging tests that I personally ordered, reviewed, and interpreted and then considered in my medical decision making (see below for details).  Favoring gastritis, also considering pancreatitis, felt less likely but also considering complication of gastric bypass or SBO.  Starting with labs, plain films and will reassess.     CT is revealing marginal ulceration at the gastric bypass anastomosis site with perforation.  Providing IV fluids, Zosyn, further pain control, plan is to transfer to [facility] where she received her bypass surgery back in 2018.  Dr. Clent Ridges was her surgeon at the time.  [name], MD [facility]  Medicine [facility] [email or web address]  Final Clinical Impressions(s) / ED Diagnoses     ICD-10-CM   1. Hx of gastric bypass  Z98.84     2. Perforation bowel (HCC)  K63.1       ED Discharge Orders     None        Discharge Instructions Discussed with and Provided to Patient:   Discharge Instructions   None       Sabas Sous, MD 10/25/20 [id number]

## 2020-10-25 NOTE — ED Notes (Signed)
Pt states a few weeks ago she began with sharp pain in her upper abdomen similar to pain tonight. Pt was scheduled to have an endoscopy done this past week however could not afford to have testing done. Pt has hx of gastric bypass and is concerned for complications from same. Pt also states blood in her stools for the last week or two.

## 2021-02-20 HISTORY — PX: HERNIA REPAIR: SHX51

## 2021-02-27 ENCOUNTER — Emergency Department (HOSPITAL_COMMUNITY): Payer: No Typology Code available for payment source

## 2021-02-27 ENCOUNTER — Encounter (HOSPITAL_COMMUNITY): Payer: Self-pay

## 2021-02-27 ENCOUNTER — Emergency Department (HOSPITAL_COMMUNITY)
Admission: EM | Admit: 2021-02-27 | Discharge: 2021-02-27 | Disposition: A | Discharge status: Home / Self Care | Payer: No Typology Code available for payment source | Attending: Emergency Medicine | Admitting: Emergency Medicine

## 2021-02-27 ENCOUNTER — Other Ambulatory Visit: Payer: Self-pay

## 2021-02-27 DIAGNOSIS — D509 Iron deficiency anemia, unspecified: Secondary | ICD-10-CM

## 2021-02-27 DIAGNOSIS — R1084 Generalized abdominal pain: Secondary | ICD-10-CM | POA: Diagnosis present

## 2021-02-27 LAB — CBC WITH DIFFERENTIAL/PLATELET
Abs Immature Granulocytes: 0.02 10*3/uL (ref 0.00–0.07)
Basophils Absolute: 0 10*3/uL (ref 0.0–0.1)
Basophils Relative: 0 %
Eosinophils Absolute: 0.1 10*3/uL (ref 0.0–0.5)
Eosinophils Relative: 1 %
HCT: 27.7 % — ABNORMAL LOW (ref 36.0–46.0)
Hemoglobin: 8.4 g/dL — ABNORMAL LOW (ref 12.0–15.0)
Immature Granulocytes: 0 %
Lymphocytes Relative: 41 %
Lymphs Abs: 3.1 10*3/uL (ref 0.7–4.0)
MCH: 22.2 pg — ABNORMAL LOW (ref 26.0–34.0)
MCHC: 30.3 g/dL (ref 30.0–36.0)
MCV: 73.1 fL — ABNORMAL LOW (ref 80.0–100.0)
Monocytes Absolute: 0.5 10*3/uL (ref 0.1–1.0)
Monocytes Relative: 6 %
Neutro Abs: 4 10*3/uL (ref 1.7–7.7)
Neutrophils Relative %: 52 %
Platelets: 294 10*3/uL (ref 150–400)
RBC: 3.79 MIL/uL — ABNORMAL LOW (ref 3.87–5.11)
RDW: 17.1 % — ABNORMAL HIGH (ref 11.5–15.5)
WBC: 7.7 10*3/uL (ref 4.0–10.5)
nRBC: 0 % (ref 0.0–0.2)

## 2021-02-27 LAB — URINALYSIS, ROUTINE W REFLEX MICROSCOPIC
Bilirubin Urine: NEGATIVE
Glucose, UA: NEGATIVE mg/dL
Hgb urine dipstick: NEGATIVE
Ketones, ur: NEGATIVE mg/dL
Leukocytes,Ua: NEGATIVE
Nitrite: NEGATIVE
Protein, ur: NEGATIVE mg/dL
Specific Gravity, Urine: 1.015 (ref 1.005–1.030)
pH: 7 (ref 5.0–8.0)

## 2021-02-27 LAB — LIPASE, BLOOD: Lipase: 30 U/L (ref 11–51)

## 2021-02-27 LAB — COMPREHENSIVE METABOLIC PANEL
ALT: 20 U/L (ref 0–44)
AST: 19 U/L (ref 15–41)
Albumin: 3.9 g/dL (ref 3.5–5.0)
Alkaline Phosphatase: 103 U/L (ref 38–126)
Anion gap: 9 (ref 5–15)
BUN: 10 mg/dL (ref 6–20)
CO2: 22 mmol/L (ref 22–32)
Calcium: 8.5 mg/dL — ABNORMAL LOW (ref 8.9–10.3)
Chloride: 106 mmol/L (ref 98–111)
Creatinine, Ser: 0.62 mg/dL (ref 0.44–1.00)
GFR, Estimated: >60 mL/min (ref >60)
Glucose, Bld: 104 mg/dL — ABNORMAL HIGH (ref 70–99)
Potassium: 3.5 mmol/L (ref 3.5–5.1)
Sodium: 137 mmol/L (ref 135–145)
Total Bilirubin: <0.1 mg/dL — ABNORMAL LOW (ref 0.3–1.2)
Total Protein: 7.1 g/dL (ref 6.5–8.1)

## 2021-02-27 LAB — HCG, SERUM, QUALITATIVE: Preg, Serum: NEGATIVE

## 2021-02-27 MED ORDER — OXYCODONE-ACETAMINOPHEN 5-325 MG PO TABS: 1.0000 | ORAL_TABLET | ORAL | 0 refills | Status: DC | PRN

## 2021-02-27 MED ORDER — ONDANSETRON 4 MG PO TBDP: 4.0000 mg | ORAL_TABLET | Freq: Three times a day (TID) | ORAL | 0 refills | Status: AC | PRN

## 2021-02-27 MED ORDER — SODIUM CHLORIDE 0.9 % IV BOLUS: 1000.0000 mL | Freq: Once | INTRAVENOUS | Status: DC

## 2021-02-27 MED ORDER — OXYCODONE-ACETAMINOPHEN 5-325 MG PO TABS: 1.0000 | ORAL_TABLET | Freq: Four times a day (QID) | ORAL | 0 refills | Status: DC | PRN

## 2021-02-27 MED ORDER — HYDROMORPHONE HCL 1 MG/ML IJ SOLN
1.0000 mg | Freq: Once | INTRAMUSCULAR | Status: AC
  Administered 2021-02-27: 1 mg via INTRAVENOUS
  Filled 2021-02-27: qty 1

## 2021-02-27 MED ORDER — ONDANSETRON HCL 4 MG/2ML IJ SOLN
4.0000 mg | Freq: Once | INTRAMUSCULAR | Status: AC
  Administered 2021-02-27: 4 mg via INTRAVENOUS
  Filled 2021-02-27: qty 2

## 2021-02-27 MED ORDER — LACTATED RINGERS IV BOLUS
1000.0000 mL | Freq: Once | INTRAVENOUS | Status: AC
  Administered 2021-02-27: 1000 mL via INTRAVENOUS

## 2021-02-27 MED ORDER — IOHEXOL 300 MG/ML  SOLN
100.0000 mL | Freq: Once | INTRAMUSCULAR | Status: AC | PRN
  Administered 2021-02-27: 100 mL via INTRAVENOUS

## 2021-02-27 MED ORDER — MORPHINE SULFATE (PF) 4 MG/ML IV SOLN
4.0000 mg | Freq: Once | INTRAVENOUS | Status: AC
  Administered 2021-02-27: 4 mg via INTRAVENOUS
  Filled 2021-02-27: qty 1

## 2021-02-27 NOTE — ED Triage Notes (Signed)
Pt brought in by [facility] for abd pain that started 6 days ago, pt has hx of perforation at gastric bypass site per ED notes on 10/25/20.

## 2021-02-27 NOTE — ED Notes (Signed)
Patient transported to CT 

## 2021-02-27 NOTE — Discharge Instructions (Signed)
Your CT scan showed some inflammation at the junction of your stomach with the intestinal bypass.  No other complications were seen.  Please take the pain medication as needed.  However, if pain is getting worse, you start running a fever, or if you start vomiting, you should return to the emergency department for reevaluation.  You should have some evaluation as to why you are having so much problems with your gastric bypass surgery.  Please follow-up with your surgeon, and also follow-up with a gastroenterologist.  I have referred you to the gastroenterologist on-call to the hospital today, but you may want to consider seeing a gastroenterologist who works with the surgeon who did your surgery.

## 2021-02-27 NOTE — ED Provider Notes (Signed)
[facility] EMERGENCY DEPARTMENT Provider Note   CSN: [id number] Arrival date & time: 02/27/21  0229     History Chief Complaint  Patient presents with   Abdominal Pain    Jovani T Fait is a 45 y.o. female.  The history is provided by the patient.  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  Pain is sharp and diffuse but worst in the periumbilical area.  There is some radiation to the back as well as to the left upper chest.  There is associated nausea but no vomiting.  She denies fever or chills.  Pain is rated at 7/10.  She has not done anything at home to treat it.  Nothing makes it better, nothing makes it worse.  Pain she is having is similar to what she had with bowel perforation previously.   History reviewed. No pertinent past medical history.  Patient Active Problem List   Diagnosis Date Noted   Perforation bowel [facility])     Past Surgical History:  Procedure Laterality Date   ABDOMINAL SURGERY     CESAREAN SECTION  x2   CHOLECYSTECTOMY     GASTRIC BYPASS  07/2016     OB History   No obstetric history on file.     Family History  Problem Relation Age of Onset   Heart attack Maternal Grandmother     Social History   Tobacco Use   Smoking status: Never   Smokeless tobacco: Never  Substance Use Topics   Alcohol use: No   Drug use: No    Home Medications Prior to Admission medications   Medication Sig Start Date End Date Taking? Authorizing Provider  DULoxetine (CYMBALTA) 60 MG capsule Take 60 mg by mouth daily.    [provider]  LORazepam (ATIVAN) 0.5 MG tablet Take 0.5 mg by mouth as needed for anxiety.    [provider]  omeprazole (PRILOSEC) 40 MG capsule Take 40 mg by mouth daily. 10/21/20   [provider]  ondansetron (ZOFRAN ODT) 4 MG disintegrating tablet Take 1 tablet (4 mg total) by mouth every 8 (eight)  hours as needed for nausea or vomiting. Patient not taking: No sig reported 01/28/20   Horton, Mayer Masker, MD  Probiotic Product (PROBIOTIC ADVANCED PO) Take 1 capsule by mouth daily.    [provider]    Allergies    Nsaids and Aspirin  Review of Systems   Review of Systems  Gastrointestinal:  Positive for abdominal pain.  All other systems reviewed and are negative.  Physical Exam Updated Vital Signs BP 135/63   Pulse 70   Temp 98.4 F (36.9 C) (Oral)   Resp 17   Ht 5\' 4"  (1.626 m)   Wt 95.3 kg   LMP 02/10/2021 (Approximate)   SpO2 100%   BMI 36.05 kg/m   Physical Exam Vitals and nursing note reviewed.  45 year old female, appears uncomfortable, but is in no acute distress. Vital signs are normal. Oxygen saturation is 100%, which is normal. Head is normocephalic and atraumatic. PERRLA, EOMI. Oropharynx is clear. Neck is nontender and supple without adenopathy or JVD. Back is nontender and there is no CVA tenderness. Lungs are clear without rales, wheezes, or rhonchi. Chest is nontender. Heart has regular rate and rhythm without murmur. Abdomen is soft, flat, with moderate tenderness diffusely.  There is no rebound or guarding.  There are no  masses or hepatosplenomegaly.  Peristalsis is hypoactive. Extremities have no cyanosis or edema, full range of motion is present. Skin is warm and dry without rash. Neurologic: Mental status is normal, cranial nerves are intact, moves all extremities equally.  ED Results / Procedures / Treatments   Labs (all labs ordered are listed, but only abnormal results are displayed) Labs Reviewed  COMPREHENSIVE METABOLIC PANEL - Abnormal; Notable for the following components:      Result Value   Glucose, Bld 104 (*)    Calcium 8.5 (*)    Total Bilirubin <0.1 (*)    All other components within normal limits  CBC WITH DIFFERENTIAL/PLATELET - Abnormal; Notable for the following components:   RBC 3.79 (*)    Hemoglobin 8.4 (*)     HCT 27.7 (*)    MCV 73.1 (*)    MCH 22.2 (*)    RDW 17.1 (*)    All other components within normal limits  LIPASE, BLOOD  URINALYSIS, ROUTINE W REFLEX MICROSCOPIC  HCG, SERUM, QUALITATIVE   Radiology CT ABDOMEN PELVIS W CONTRAST  Result Date: 02/27/2021 CLINICAL DATA:  Peritonitis or perforation suspected. Unspecified abdominal pain. EXAM: CT ABDOMEN AND PELVIS WITH CONTRAST TECHNIQUE: Multidetector CT imaging of the abdomen and pelvis was performed using the standard protocol following bolus administration of intravenous contrast. CONTRAST:  OMNIPAQUE IOHEXOL 300 MG/ML  SOLN COMPARISON:  10/25/2020 FINDINGS: Lower chest: No acute abnormality. Hepatobiliary: No focal liver abnormality is seen. Status post cholecystectomy. No biliary dilatation. Pancreas: Unremarkable Spleen: Unremarkable Adrenals/Urinary Tract: The adrenal glands are unremarkable. Horseshoe kidney noted. No enhancing cortical masses. No hydronephrosis. No intrarenal or ureteral calculi. The bladder is unremarkable. Stomach/Bowel: Surgical changes of Roux-en-Y gastric bypass are identified. There are subtle inflammatory changes again identified at the gastrojejunostomy best seen on axial image # 38/2. No extraluminal gas or fluid, however, identified to suggest perforation at this time. No obstruction. The small and large bowel are otherwise unremarkable. Appendix normal. Vascular/Lymphatic: No significant vascular findings are present. No enlarged abdominal or pelvic lymph nodes. Reproductive: Uterus and bilateral adnexa are unremarkable. Other: No abdominal wall hernia or abnormality. No abdominopelvic ascites. 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 No acute bone abnormality. IMPRESSION: Surgical changes of Roux-en-Y gastric bypass with subtle inflammatory changes again identified at the gastrojejunostomy. No evidence of obstruction or perforation perforation at this  time. Electronically Signed   By: Helyn Numbers M.D.   On: 02/27/2021 04:26    Procedures Procedures   Medications Ordered in ED Medications  ondansetron (ZOFRAN) injection 4 mg (4 mg Intravenous Given 02/27/21 0301)  lactated ringers bolus 1,000 mL (1,000 mLs Intravenous New Bag/Given 02/27/21 0302)  morphine 4 MG/ML injection 4 mg (4 mg Intravenous Given 02/27/21 0302)  HYDROmorphone (DILAUDID) injection 1 mg (1 mg Intravenous Given 02/27/21 0343)  iohexol (OMNIPAQUE) 300 MG/ML solution 100 mL (100 mLs Intravenous Contrast Given 02/27/21 0405)    ED Course  I have reviewed the triage vital signs and the nursing notes.  Pertinent labs & imaging results that were available during my care of the patient were reviewed by me and considered in my medical decision making (see chart for details).    MDM Rules/Calculators/A&P                         Abdominal pain and patient with history of bowel perforation as a complication of gastric bypass.  Symptoms are concerning  for recurrence, but also need to consider other causes of abdominal pain such as peptic ulcer, diverticulitis, pancreatitis.  She will be given IV fluids, morphine, ondansetron and will send for CT of abdomen and pelvis.  Labs show microcytic anemia which is unchanged from baseline.  WBC is normal with normal differential.  CT scan shows subtle inflammatory changes at the gastrojejunostomy with no evidence of obstruction or perforation.  Patient did get adequate pain relief with initial dose of morphine, and an additional dose of hydromorphone.  At this point, pain is rated at 5/10.  Abdominal exam is still benign without evidence of peritonitis.  It is felt she is safe for discharge with strict instructions to return if her symptoms are worsening.  She is already taking a proton pump inhibitor and is to continue taking that.  I have recommended she follow-up with her surgeon, and also to see a gastroenterologist to consider endoscopy to  see if there are any issues related to the anastomosis.  She is discharged with prescription for ondansetron and a small number of oxycodone-acetaminophen tablets.  Final Clinical Impression(s) / ED Diagnoses Final diagnoses:  Generalized abdominal pain  Microcytic anemia    Rx / DC Orders ED Discharge Orders          Ordered    ondansetron (ZOFRAN ODT) 4 MG disintegrating tablet  Every 8 hours PRN        02/27/21 0450    oxyCODONE-acetaminophen (PERCOCET/ROXICET) 5-325 MG tablet  Every 6 hours PRN        02/27/21 0450    oxyCODONE-acetaminophen (PERCOCET) 5-325 MG tablet  Every 4 hours PRN        02/27/21 0450             Dione Booze, MD 02/27/21 [id number]

## 2021-02-28 MED FILL — Oxycodone w/ Acetaminophen Tab 5-325 MG: ORAL | Qty: 6 | Status: AC

## 2021-07-30 ENCOUNTER — Encounter: Payer: Self-pay | Admitting: Obstetrics & Gynecology

## 2021-08-04 ENCOUNTER — Other Ambulatory Visit: Payer: No Typology Code available for payment source | Admitting: Obstetrics & Gynecology

## 2021-09-29 ENCOUNTER — Other Ambulatory Visit: Payer: No Typology Code available for payment source | Admitting: Adult Health

## 2021-10-22 ENCOUNTER — Other Ambulatory Visit (HOSPITAL_COMMUNITY)
Admission: RE | Admit: 2021-10-22 | Discharge: 2021-10-22 | Disposition: A | Discharge status: Home / Self Care | Payer: No Typology Code available for payment source | Source: Ambulatory Visit | Attending: Obstetrics & Gynecology | Admitting: Obstetrics & Gynecology

## 2021-10-22 ENCOUNTER — Ambulatory Visit (INDEPENDENT_AMBULATORY_CARE_PROVIDER_SITE_OTHER): Payer: No Typology Code available for payment source | Admitting: Adult Health

## 2021-10-22 ENCOUNTER — Encounter: Payer: Self-pay | Admitting: Adult Health

## 2021-10-22 VITALS — BP 113/75 | HR 67 | Ht 64.25 in | Wt 192.0 lb

## 2021-10-22 DIAGNOSIS — R1032 Left lower quadrant pain: Secondary | ICD-10-CM | POA: Insufficient documentation

## 2021-10-22 DIAGNOSIS — Z01419 Encounter for gynecological examination (general) (routine) without abnormal findings: Secondary | ICD-10-CM | POA: Insufficient documentation

## 2021-10-22 DIAGNOSIS — Z1211 Encounter for screening for malignant neoplasm of colon: Secondary | ICD-10-CM | POA: Diagnosis not present

## 2021-10-22 DIAGNOSIS — K439 Ventral hernia without obstruction or gangrene: Secondary | ICD-10-CM | POA: Diagnosis not present

## 2021-10-22 LAB — HEMOCCULT GUIAC POC 1CARD (OFFICE): Fecal Occult Blood, POC: NEGATIVE

## 2021-10-22 NOTE — Progress Notes (Signed)
Patient ID: Danielle Pope, female   DOB: [birth year], 46 y.o.   MRN: [id number] History of Present Illness: Danielle Pope is a 46 year old white female,married, G2P2 in for a well woman gyn exam and pap. She is new pt. She has started having some pain in left side and in vagina area, esp with orgasm, feels like muscles tight. She had gastric bypass in 2018, has lost about 70 lbs. March 17 2021 she had bowel obstruction and hernia surgery and thinks hernia is back. Periods 3-4 days and has more cramping now.  PCP is Danielle Olp NP   Current Medications, Allergies, Past Medical History, Past Surgical History, Family History and Social History were reviewed in [employer] record.     Review of Systems: Patient denies any headaches, hearing loss, fatigue, blurred vision, shortness of breath, chest pain, abdominal pain, problems with bowel movements, urination.  No joint pain or mood swings.  See HPI for positives.    Physical Exam:BP 113/75 (BP Location: Left Arm, Patient Position: Sitting, Cuff Size: Normal)   Pulse 67   Ht 5' 4.25" (1.632 m)   Wt 192 lb (87.1 kg)   LMP 10/14/2021   BMI 32.70 kg/m   General:  Well developed, well nourished, no acute distress Skin:  Warm and dry,tan Neck:  Midline trachea, normal thyroid, good ROM, no lymphadenopathy Lungs; Clear to auscultation bilaterally Breast:  No dominant palpable mass, retraction, or nipple discharge Cardiovascular: Regular rate and rhythm Abdomen:  Soft,  no hepatosplenomegaly, has vertical scar and ventral hernia to the right. Pelvic:  External genitalia is normal in appearance, no lesions.  The vagina is normal in appearance. Has some discomfort to the left side wall. Urethra has no lesions or masses. The cervix is smooth, pap with HR HPV genotyping preformed.  Uterus is felt to be normal size, shape, and contour.  No adnexal masses, LLQ tenderness noted.Bladder is non tender, no masses felt. Rectal: Good  sphincter tone, no polyps, or hemorrhoids felt.  Hemoccult negative. Extremities/musculoskeletal:  No swelling or varicosities noted, no clubbing or cyanosis Psych:  No mood changes, alert and cooperative,seems happy AA is 0 Fall risk is low    10/22/2021    9:19 AM  Depression screen PHQ 2/9  Decreased Interest 0  Down, Depressed, Hopeless 0  PHQ - 2 Score 0  Altered sleeping 1  Tired, decreased energy 1  Change in appetite 0  Feeling bad or failure about yourself  0  Trouble concentrating 0  Moving slowly or fidgety/restless 0  Suicidal thoughts 0  PHQ-9 Score 2       10/22/2021    9:19 AM  GAD 7 : Generalized Anxiety Score  Nervous, Anxious, on Edge 1  Control/stop worrying 1  Worry too much - different things 1  Trouble relaxing 2  Restless 1  Easily annoyed or irritable 1  Afraid - awful might happen 0  Total GAD 7 Score 7      Upstream - 10/22/21 0917       Pregnancy Intention Screening   Does the patient want to become pregnant in the next year? No    Does the patient's partner want to become pregnant in the next year? No    Would the patient like to discuss contraceptive options today? No      Contraception Wrap Up   Current Method Female Sterilization    End Method Female Sterilization  Examination chaperoned by Malachy Mood LPN  Impression and Plan: 1. Encounter for gynecological examination with Papanicolaou smear of cervix Pap sent Pap in 3 years if normal GYN physical in 1 year Labs with PCP - Cytology - PAP( [place])  2. Encounter for screening fecal occult blood testing Hemoccult was negative  - POCT occult blood stool  3. LLQ pain Will get pelvic US 10/27/21 at 3:30 pm at [facility] to assess uterus and ovaries  Will talk when results back - US PELVIC COMPLETE WITH TRANSVAGINAL; Future  4. Ventral hernia without obstruction or gangrene May want to follow up with surgeon

## 2021-10-24 LAB — CYTOLOGY - PAP
Adequacy: ABSENT
Comment: NEGATIVE
Diagnosis: NEGATIVE
High risk HPV: NEGATIVE

## 2021-10-27 ENCOUNTER — Ambulatory Visit (HOSPITAL_COMMUNITY)
Admission: RE | Admit: 2021-10-27 | Discharge: 2021-10-27 | Disposition: A | Discharge status: Home / Self Care | Payer: No Typology Code available for payment source | Source: Ambulatory Visit | Attending: Adult Health | Admitting: Adult Health

## 2021-10-27 DIAGNOSIS — R1032 Left lower quadrant pain: Secondary | ICD-10-CM | POA: Insufficient documentation

## 2022-11-22 IMAGING — CT CT ABD-PELV W/ CM
2 of 5 series · 16 of 46 positions shown, 18 images · IV contrast (omnipaque)
Comparison: 01/27/2020

CLINICAL DATA: Bowel obstruction suspected. Abdominal pain since
10/24/2020 with blood in stool

EXAM:
CT ABDOMEN AND PELVIS WITH CONTRAST
TECHNIQUE: Multidetector CT imaging of the abdomen and pelvis was performed
using the standard protocol following bolus administration of
intravenous contrast.
CONTRAST:  85mL OMNIPAQUE IOHEXOL 350 MG/ML SOLN

[Series 2: axial st · axial · 0.85mm/px · z∈[+528,+928]mm · 13 of 94 slices shown, 15 images]
[im 7/94  soft-tissue]
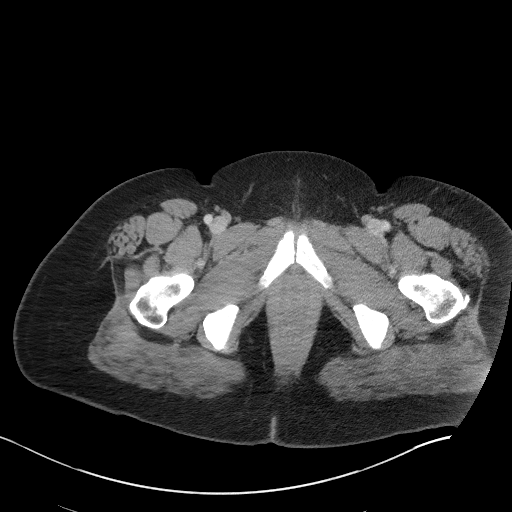
[im 7/94  bone]
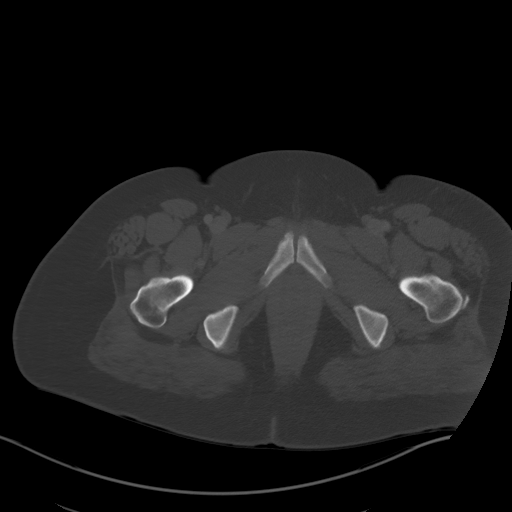
[im 13/94  soft-tissue]
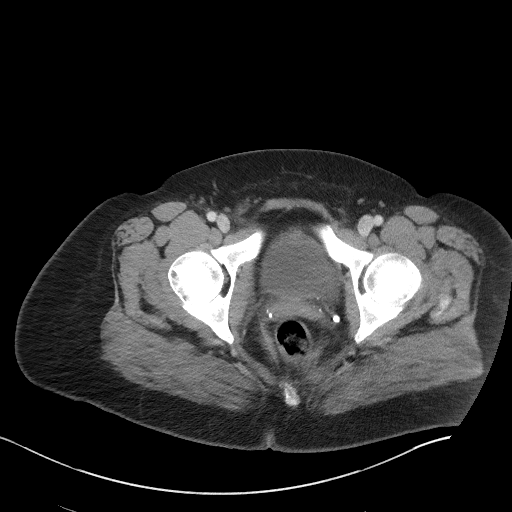
[im 19/94  soft-tissue]
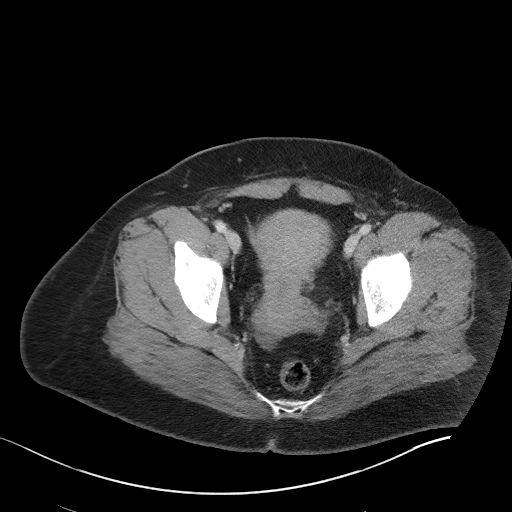
[im 25/94  soft-tissue]
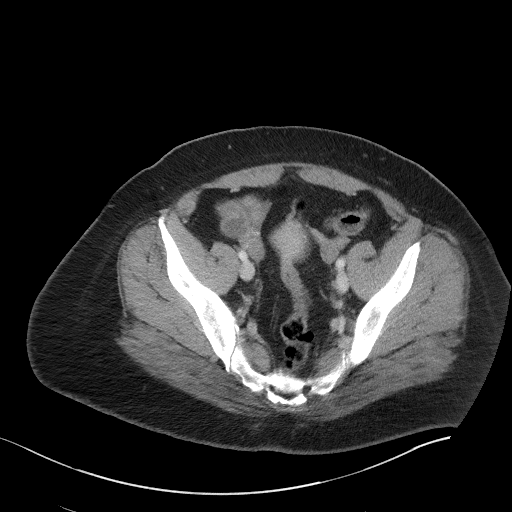
[im 32/94  soft-tissue]
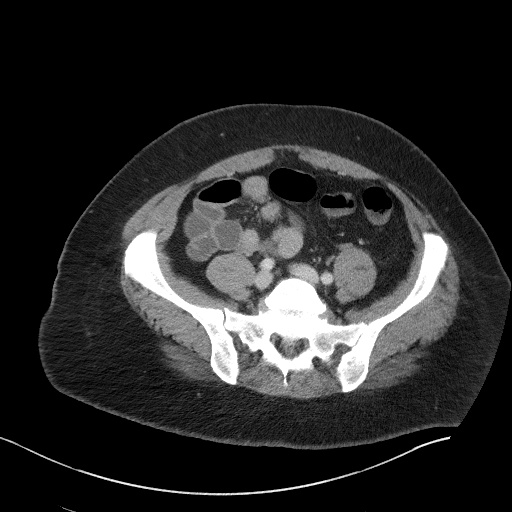
[im 38/94  soft-tissue]
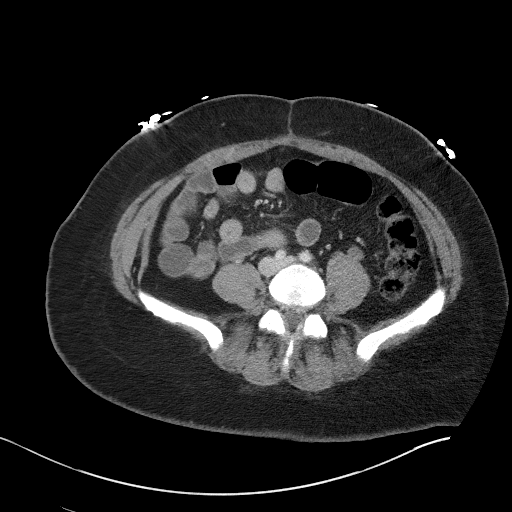
[im 50/94  soft-tissue]
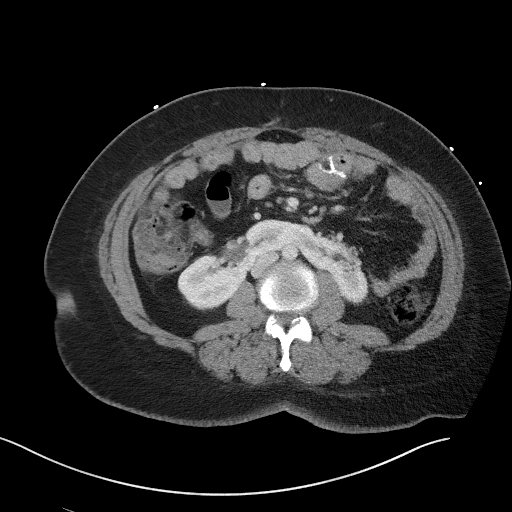
[im 56/94  soft-tissue]
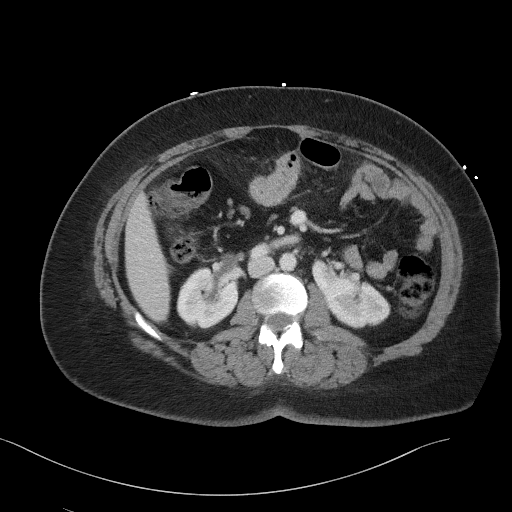
[im 63/94  soft-tissue]
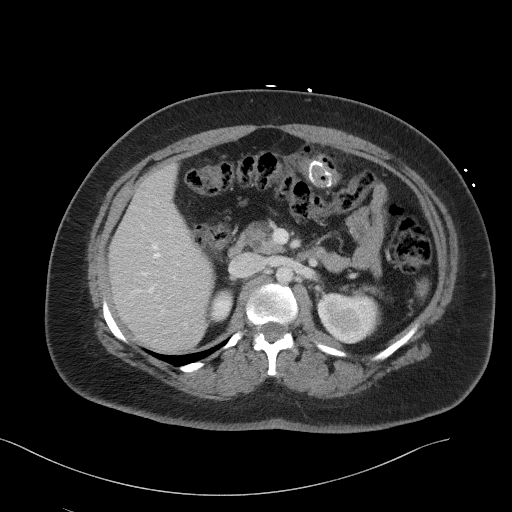
[im 63/94  bone]
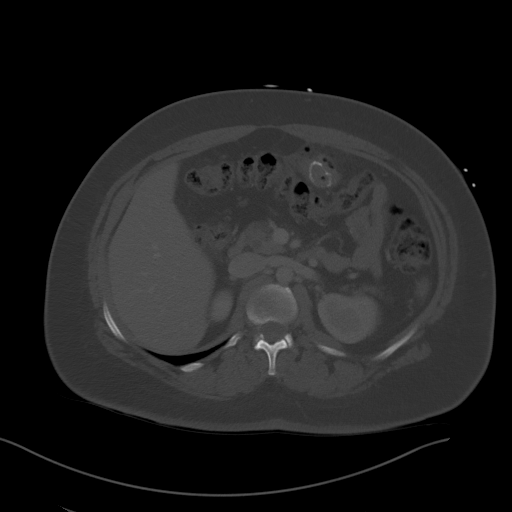
[im 69/94  soft-tissue]
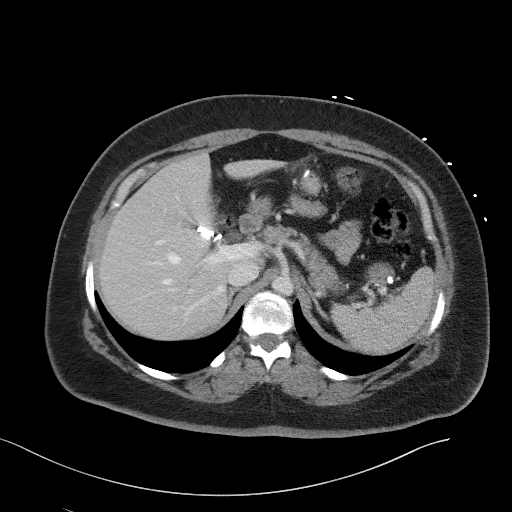
[im 75/94  soft-tissue]
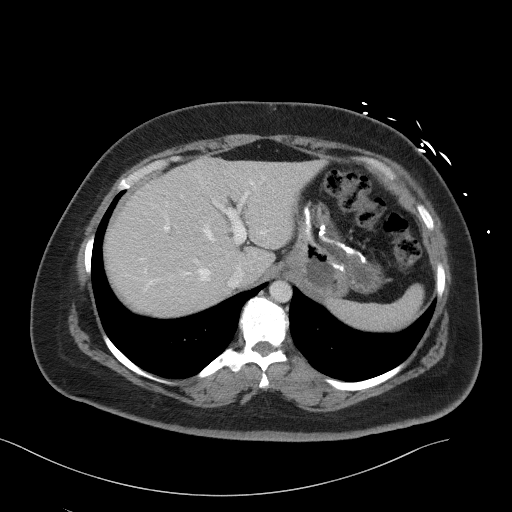
[im 81/94  soft-tissue]
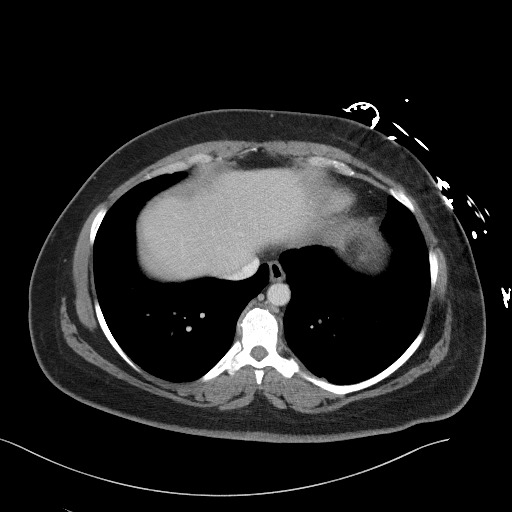
[im 87/94  soft-tissue]
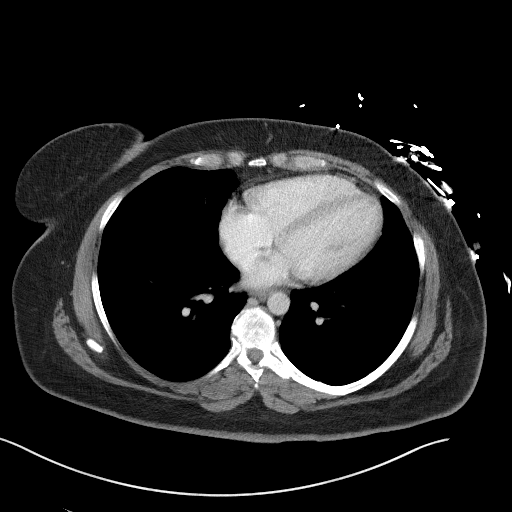

[Series 6: coronal st · coronal · 0.82mm/px · 3 of 117 slices shown]
[im 39/117  soft-tissue]
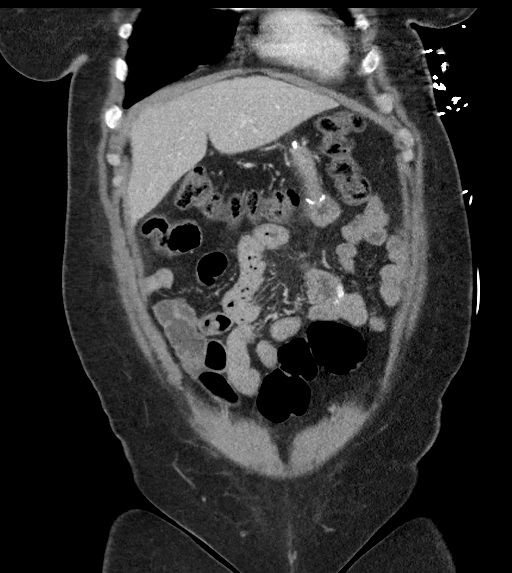
[im 52/117  soft-tissue]
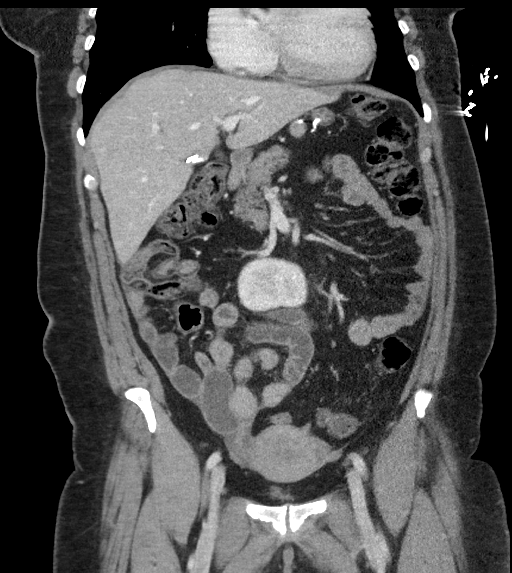
[im 65/117  soft-tissue]
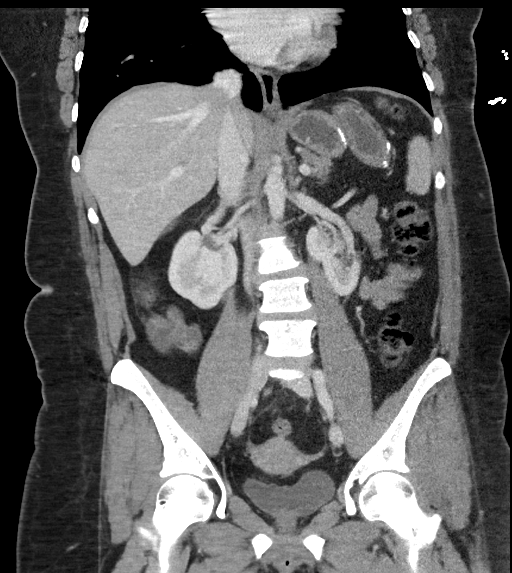

[16 of 46 positions shown; findings below may reference images not displayed]

FINDINGS: Lower chest:  No contributory findings.

Hepatobiliary: No focal liver abnormality.Cholecystectomy. No bile
duct dilatation.

Pancreas: Unremarkable.

Spleen: Unremarkable.

Adrenals/Urinary Tract: Negative adrenals.Horseshoe kidney. No
hydronephrosis or stone. Unremarkable bladder.

Stomach/Bowel: Roux-en-Y gastric bypass. Newly seen fat inflammation
at the gastroenteric anastomosis with bubbles of pneumoperitoneum
about the anastomosis and reaching below the left diaphragm, see
reformats. No bowel obstruction.

Vascular/Lymphatic: No acute vascular abnormality. No mass or
adenopathy.

Reproductive:No pathologic findings.

Other: Trace peritoneal fluid in the pelvis which could be reactive
or physiologic in this setting.

Musculoskeletal: No acute abnormalities. L5 chronic pars defects
with L5-S1 anterolisthesis, disc narrowing, and biforaminal
impingement.

Critical Value/emergent results were called by telephone at the time
of interpretation on 10/25/2020 at [DATE] to provider ISABEL C FAROOQ ,
who verbally acknowledged these results.
IMPRESSION: Gastric bypass complicated by marginal inflammation with perforation
and small volume pneumoperitoneum.

## 2023-03-27 IMAGING — CT CT ABD-PELV W/ CM
2 of 4 series · 16 of 46 positions shown, 18 images · IV contrast (Omnipaque or Isovue)
Comparison: 10/25/2020

CLINICAL DATA: Peritonitis or perforation suspected. Unspecified
abdominal pain.

EXAM:
CT ABDOMEN AND PELVIS WITH CONTRAST
TECHNIQUE: Multidetector CT imaging of the abdomen and pelvis was performed
using the standard protocol following bolus administration of
intravenous contrast.
CONTRAST:  100mL OMNIPAQUE IOHEXOL 300 MG/ML  SOLN

[Series 2: axial st · axial · 0.73mm/px · z∈[+1028,+1468]mm · 13 of 98 slices shown, 15 images]
[im 5/98  soft-tissue]
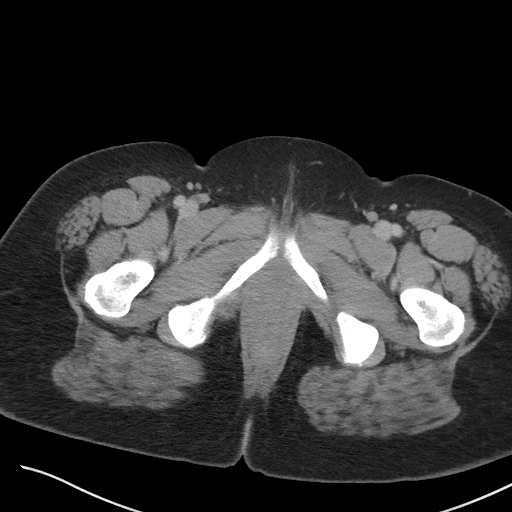
[im 5/98  bone]
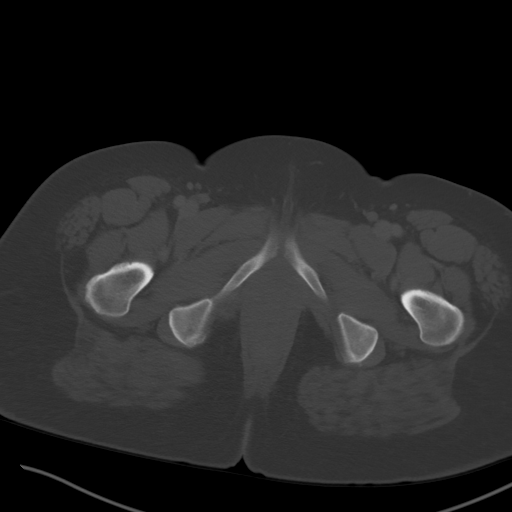
[im 15/98  soft-tissue]
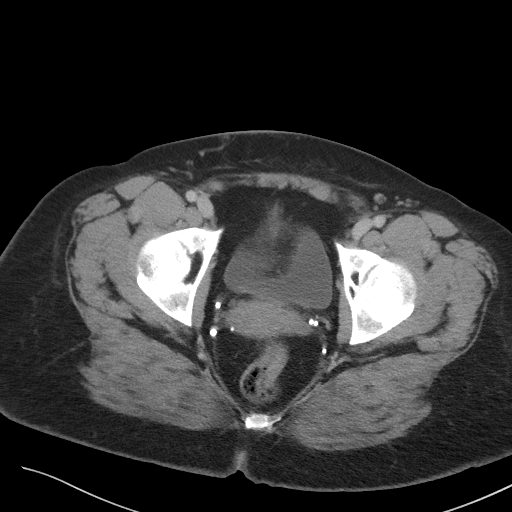
[im 20/98  soft-tissue]
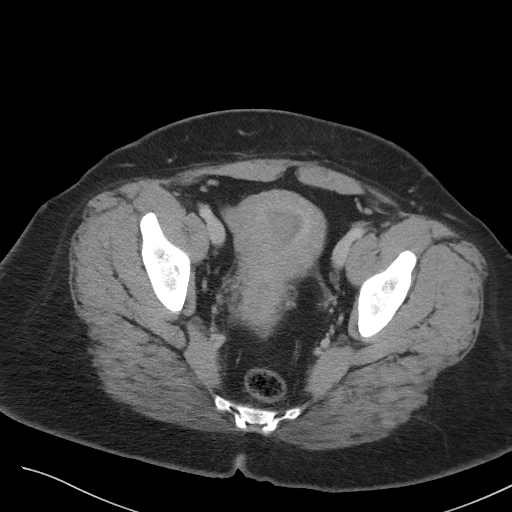
[im 30/98  soft-tissue]
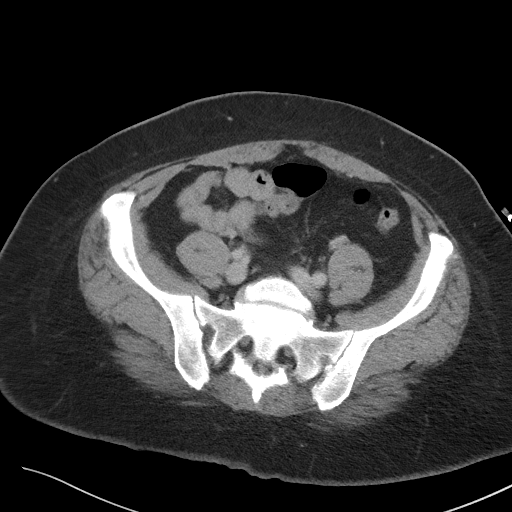
[im 34/98  soft-tissue]
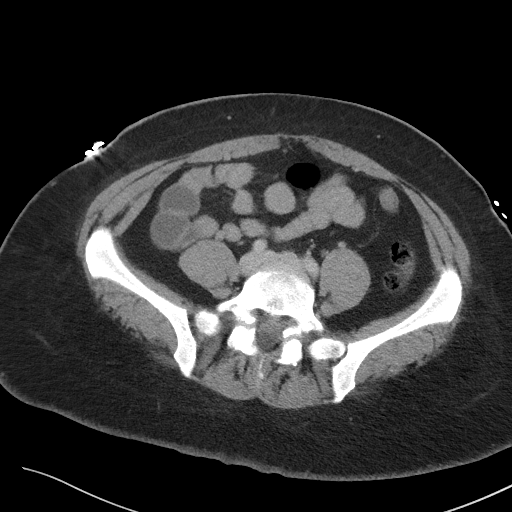
[im 44/98  soft-tissue]
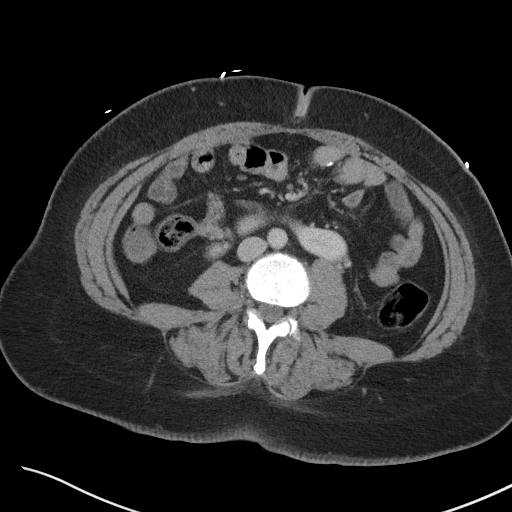
[im 49/98  soft-tissue]
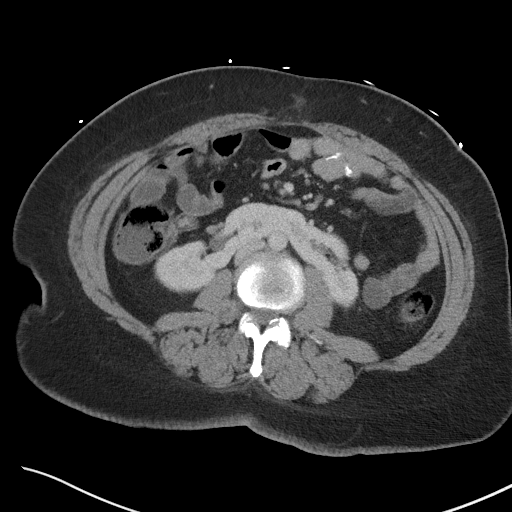
[im 54/98  soft-tissue]
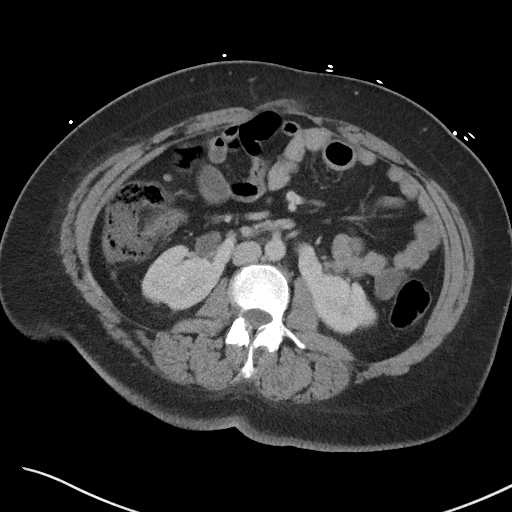
[im 64/98  soft-tissue]
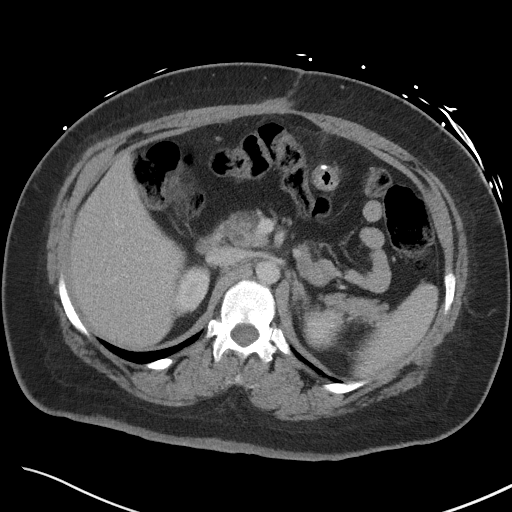
[im 64/98  bone]
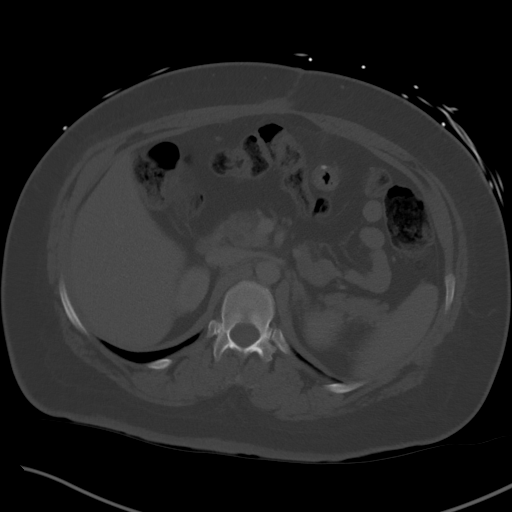
[im 68/98  soft-tissue]
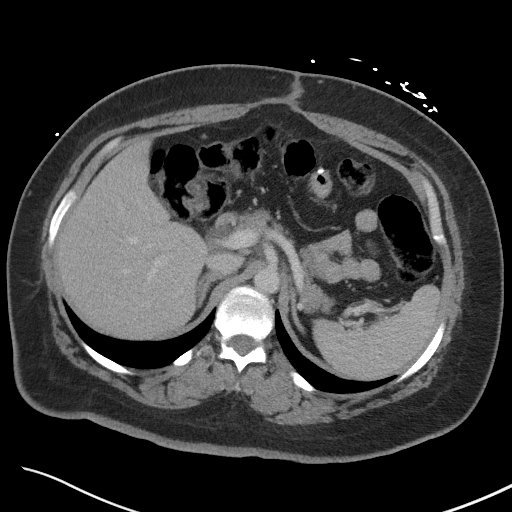
[im 78/98  soft-tissue]
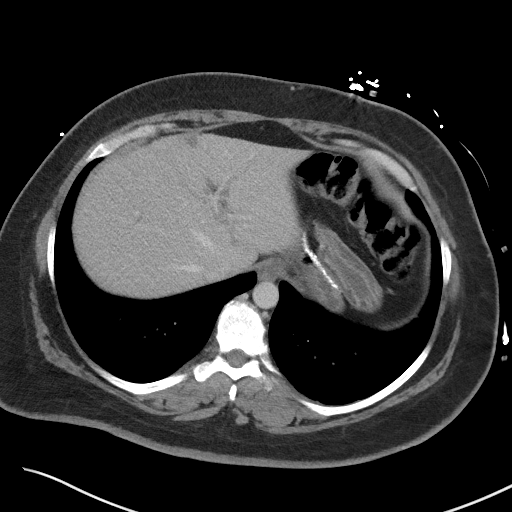
[im 83/98  soft-tissue]
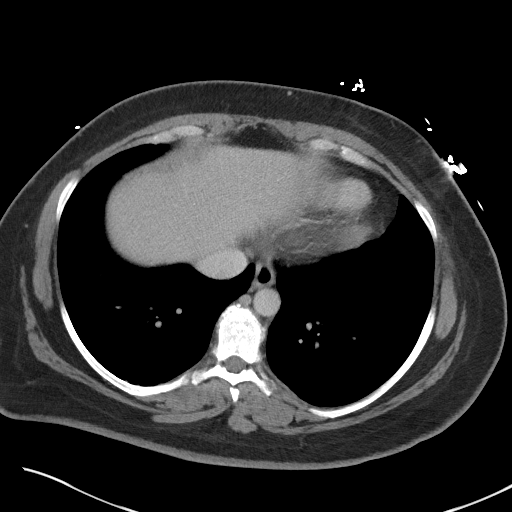
[im 93/98  soft-tissue]
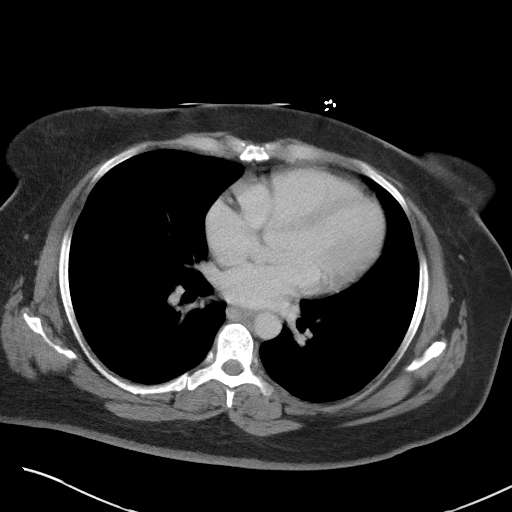

[Series 5: coronal st · coronal · 0.78mm/px · 3 of 88 slices shown]
[im 30/88  soft-tissue]
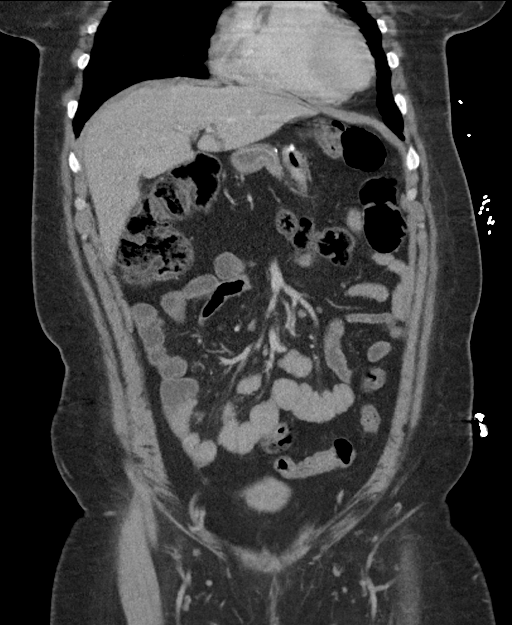
[im 39/88  soft-tissue]
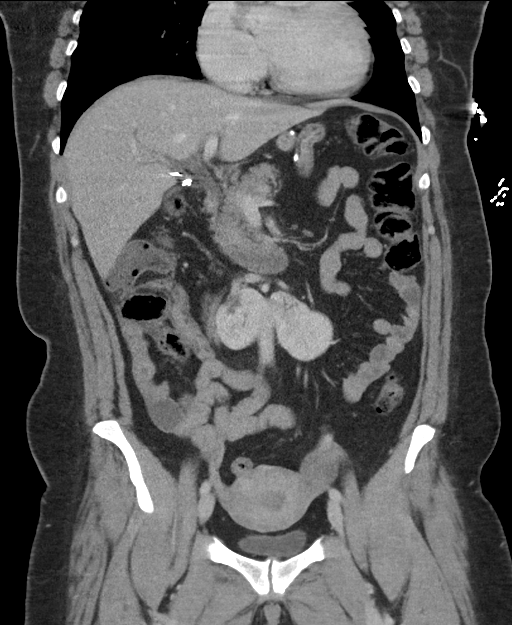
[im 49/88  soft-tissue]
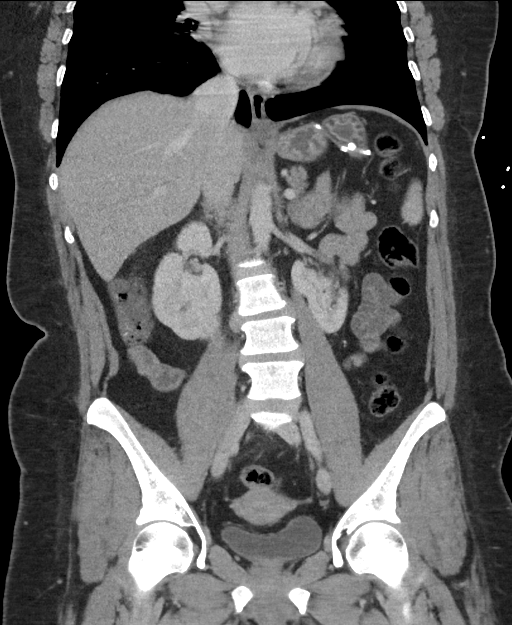

[16 of 46 positions shown; findings below may reference images not displayed]

FINDINGS: Lower chest: No acute abnormality.

Hepatobiliary: No focal liver abnormality is seen. Status post
cholecystectomy. No biliary dilatation.

Pancreas: Unremarkable

Spleen: Unremarkable

Adrenals/Urinary Tract: The adrenal glands are unremarkable.
Horseshoe kidney noted. No enhancing cortical masses. No
hydronephrosis. No intrarenal or ureteral calculi. The bladder is
unremarkable.

Stomach/Bowel: Surgical changes of Roux-en-Y gastric bypass are
identified. There are subtle inflammatory changes again identified
at the gastrojejunostomy best seen on axial image # 38/2. No
extraluminal gas or fluid, however, identified to suggest
perforation at this time. No obstruction. The small and large bowel
are otherwise unremarkable. Appendix normal.

Vascular/Lymphatic: No significant vascular findings are present. No
enlarged abdominal or pelvic lymph nodes.

Reproductive: Uterus and bilateral adnexa are unremarkable.

Other: No abdominal wall hernia or abnormality. No abdominopelvic
ascites.

Musculoskeletal: Bilateral L5 pars defects are present with stable
grade 1 anterolisthesis at L5-S1. No acute bone abnormality.
IMPRESSION: Surgical changes of Roux-en-Y gastric bypass with subtle
inflammatory changes again identified at the gastrojejunostomy. No
evidence of obstruction or perforation perforation at this time.
